# Patient Record
Sex: Female | Born: 1950
Health system: Southern US, Community
[De-identification: ages and names within clinical notes are randomized; demographics above are authoritative.]

## PROBLEM LIST (undated history)

## (undated) DIAGNOSIS — O24419 Gestational diabetes mellitus in pregnancy, unspecified control: Secondary | ICD-10-CM

## (undated) DIAGNOSIS — E785 Hyperlipidemia, unspecified: Secondary | ICD-10-CM

## (undated) DIAGNOSIS — I340 Nonrheumatic mitral (valve) insufficiency: Secondary | ICD-10-CM

## (undated) DIAGNOSIS — I10 Essential (primary) hypertension: Secondary | ICD-10-CM

## (undated) DIAGNOSIS — I493 Ventricular premature depolarization: Secondary | ICD-10-CM

## (undated) DIAGNOSIS — I471 Supraventricular tachycardia, unspecified: Secondary | ICD-10-CM

## (undated) DIAGNOSIS — R002 Palpitations: Secondary | ICD-10-CM

## (undated) HISTORY — DX: Ventricular premature depolarization: I49.3

## (undated) HISTORY — DX: Essential (primary) hypertension: I10

## (undated) HISTORY — DX: Supraventricular tachycardia: I47.1

## (undated) HISTORY — DX: Palpitations: R00.2

## (undated) HISTORY — DX: Nonrheumatic mitral (valve) insufficiency: I34.0

## (undated) HISTORY — DX: Supraventricular tachycardia, unspecified: I47.10

## (undated) HISTORY — PX: US ECHOCARDIOGRAPHY: HXRAD669

## (undated) HISTORY — DX: Gestational diabetes mellitus in pregnancy, unspecified control: O24.419

## (undated) HISTORY — DX: Hyperlipidemia, unspecified: E78.5

---

## 1998-06-11 ENCOUNTER — Other Ambulatory Visit: Admission: RE | Admit: 1998-06-11 | Discharge: 1998-06-11 | Payer: Self-pay | Admitting: Gynecology

## 1999-06-04 ENCOUNTER — Other Ambulatory Visit: Admission: RE | Admit: 1999-06-04 | Discharge: 1999-06-04 | Payer: Self-pay | Admitting: Gynecology

## 2000-06-16 ENCOUNTER — Encounter: Payer: Self-pay | Admitting: Gynecology

## 2000-06-16 ENCOUNTER — Encounter: Admission: RE | Admit: 2000-06-16 | Discharge: 2000-06-16 | Payer: Self-pay | Admitting: Gynecology

## 2000-06-23 ENCOUNTER — Other Ambulatory Visit: Admission: RE | Admit: 2000-06-23 | Discharge: 2000-06-23 | Payer: Self-pay | Admitting: Gynecology

## 2001-06-17 ENCOUNTER — Encounter: Admission: RE | Admit: 2001-06-17 | Discharge: 2001-06-17 | Payer: Self-pay | Admitting: Gynecology

## 2001-06-17 ENCOUNTER — Encounter: Payer: Self-pay | Admitting: Gynecology

## 2001-06-21 ENCOUNTER — Encounter: Admission: RE | Admit: 2001-06-21 | Discharge: 2001-06-21 | Payer: Self-pay | Admitting: Gynecology

## 2001-06-21 ENCOUNTER — Encounter: Payer: Self-pay | Admitting: Gynecology

## 2001-06-28 ENCOUNTER — Other Ambulatory Visit: Admission: RE | Admit: 2001-06-28 | Discharge: 2001-06-28 | Payer: Self-pay | Admitting: Gynecology

## 2002-06-22 ENCOUNTER — Encounter: Payer: Self-pay | Admitting: Gynecology

## 2002-06-22 ENCOUNTER — Encounter: Admission: RE | Admit: 2002-06-22 | Discharge: 2002-06-22 | Payer: Self-pay | Admitting: Gynecology

## 2002-06-29 ENCOUNTER — Other Ambulatory Visit: Admission: RE | Admit: 2002-06-29 | Discharge: 2002-06-29 | Payer: Self-pay | Admitting: Gynecology

## 2003-06-28 ENCOUNTER — Encounter: Admission: RE | Admit: 2003-06-28 | Discharge: 2003-06-28 | Payer: Self-pay | Admitting: Gynecology

## 2003-06-28 ENCOUNTER — Encounter: Payer: Self-pay | Admitting: Gynecology

## 2003-06-30 ENCOUNTER — Encounter: Payer: Self-pay | Admitting: Gynecology

## 2003-06-30 ENCOUNTER — Encounter: Admission: RE | Admit: 2003-06-30 | Discharge: 2003-06-30 | Payer: Self-pay | Admitting: Gynecology

## 2003-07-04 ENCOUNTER — Other Ambulatory Visit: Admission: RE | Admit: 2003-07-04 | Discharge: 2003-07-04 | Payer: Self-pay | Admitting: Gynecology

## 2004-06-28 ENCOUNTER — Encounter: Admission: RE | Admit: 2004-06-28 | Discharge: 2004-06-28 | Payer: Self-pay | Admitting: Gynecology

## 2004-07-04 ENCOUNTER — Other Ambulatory Visit: Admission: RE | Admit: 2004-07-04 | Discharge: 2004-07-04 | Payer: Self-pay | Admitting: Gynecology

## 2005-07-01 ENCOUNTER — Encounter: Admission: RE | Admit: 2005-07-01 | Discharge: 2005-07-01 | Payer: Self-pay | Admitting: Gynecology

## 2005-07-08 ENCOUNTER — Other Ambulatory Visit: Admission: RE | Admit: 2005-07-08 | Discharge: 2005-07-08 | Payer: Self-pay | Admitting: Gynecology

## 2006-07-02 ENCOUNTER — Encounter: Admission: RE | Admit: 2006-07-02 | Discharge: 2006-07-02 | Payer: Self-pay | Admitting: Family Medicine

## 2006-08-05 ENCOUNTER — Other Ambulatory Visit: Admission: RE | Admit: 2006-08-05 | Discharge: 2006-08-05 | Payer: Self-pay | Admitting: Gynecology

## 2007-08-25 ENCOUNTER — Other Ambulatory Visit: Admission: RE | Admit: 2007-08-25 | Discharge: 2007-08-25 | Payer: Self-pay | Admitting: Gynecology

## 2009-06-19 ENCOUNTER — Encounter: Admission: RE | Admit: 2009-06-19 | Discharge: 2009-06-19 | Payer: Self-pay | Admitting: Family Medicine

## 2009-10-21 ENCOUNTER — Encounter: Admission: RE | Admit: 2009-10-21 | Discharge: 2009-10-21 | Payer: Self-pay | Admitting: Family Medicine

## 2011-07-02 ENCOUNTER — Encounter: Payer: Self-pay | Admitting: Cardiology

## 2011-07-08 ENCOUNTER — Ambulatory Visit (INDEPENDENT_AMBULATORY_CARE_PROVIDER_SITE_OTHER): Payer: BC Managed Care – PPO | Admitting: Cardiology

## 2011-07-08 ENCOUNTER — Encounter: Payer: Self-pay | Admitting: Cardiology

## 2011-07-08 DIAGNOSIS — I498 Other specified cardiac arrhythmias: Secondary | ICD-10-CM

## 2011-07-08 DIAGNOSIS — E785 Hyperlipidemia, unspecified: Secondary | ICD-10-CM | POA: Insufficient documentation

## 2011-07-08 DIAGNOSIS — R5381 Other malaise: Secondary | ICD-10-CM

## 2011-07-08 DIAGNOSIS — R55 Syncope and collapse: Secondary | ICD-10-CM

## 2011-07-08 DIAGNOSIS — I10 Essential (primary) hypertension: Secondary | ICD-10-CM | POA: Insufficient documentation

## 2011-07-08 DIAGNOSIS — I471 Supraventricular tachycardia: Secondary | ICD-10-CM

## 2011-07-08 DIAGNOSIS — R5383 Other fatigue: Secondary | ICD-10-CM

## 2011-07-08 LAB — BASIC METABOLIC PANEL
BUN: 18 mg/dL (ref 6–23)
CO2: 30 mEq/L (ref 19–32)
Calcium: 9 mg/dL (ref 8.4–10.5)
Creatinine, Ser: 0.6 mg/dL (ref 0.4–1.2)
GFR: 103.92 mL/min (ref >60.00)
Glucose, Bld: 103 mg/dL — ABNORMAL HIGH (ref 70–99)

## 2011-07-08 LAB — CBC WITH DIFFERENTIAL/PLATELET
Basophils Absolute: 0 10*3/uL (ref 0.0–0.1)
HCT: 44.3 % (ref 36.0–46.0)
Hemoglobin: 15.1 g/dL — ABNORMAL HIGH (ref 12.0–15.0)
Lymphs Abs: 1 10*3/uL (ref 0.7–4.0)
MCHC: 34.2 g/dL (ref 30.0–36.0)
MCV: 93.4 fl (ref 78.0–100.0)
Monocytes Relative: 9.5 % (ref 3.0–12.0)
Platelets: 174 10*3/uL (ref 150.0–400.0)
RBC: 4.75 Mil/uL (ref 3.87–5.11)
RDW: 12.3 % (ref 11.5–14.6)

## 2011-07-08 LAB — MAGNESIUM: Magnesium: 2.6 mg/dL — ABNORMAL HIGH (ref 1.5–2.5)

## 2011-07-08 NOTE — Patient Instructions (Signed)
We will schedule you for lab work and an Echocardiogram today.  We will get a copy of your holter monitor.  We will call with the results and let you know if we should change your therapy.

## 2011-07-08 NOTE — Progress Notes (Signed)
Jean [name] Date of Birth: 05-25-1951   History of Present Illness: Jean [name] is seen at the request of Dr. Christell Constant for evaluation of near syncope. She is a pleasant 60 year old white female who has a history of hypertension and hyperlipidemia. She has a prior history of supraventricular tachycardia. She was evaluated here in December of 2009 including a stress echo which was normal and an event monitor which showed no episodes of tachycardia over one month. She has had some intermittent palpitations. On July 2 while driving she states she experience waves of lightheadedness and a feeling that she might pass out. Since that time she has complained of feeling faint. She has had a couple of episodes of significant flushing. With this she felt sweaty and clammy. She awoke one morning at 2:30 AM with a sensation that her heart was racing. She has felt very jittery and anxious. She reports that she cannot sleep at night. She complains of blurred vision. She has had some tinnitus as well. She had a 24-hour Holter monitor placed which demonstrated a mean heart rate of 65 beats per minute. She had no sustained tachycardia arrhythmia or bradycardia arrhythmia. She had a total 1734 PVCs which were isolated. There were episodes of bigeminy and trigeminy. She had very rare PACs with one 3 beat run of PACs.  Current Outpatient Prescriptions on File Prior to Visit  Medication Sig Dispense Refill  . aspirin 81 MG tablet Take 81 mg by mouth daily.        Marland Kitchen ezetimibe (ZETIA) 10 MG tablet Take 10 mg by mouth daily.        Marland Kitchen glucosamine-chondroitin 500-400 MG tablet Take 1 tablet by mouth 3 (three) times daily.        . Multiple Vitamin (MULTIVITAMIN) tablet Take 1 tablet by mouth daily.        . Omega-3 Fatty Acids (GNP FISH OIL) 1000 MG CAPS Take by mouth.        . ramipril (ALTACE) 10 MG tablet Take 10 mg by mouth daily.        . simvastatin (ZOCOR) 20 MG tablet       . valsartan-hydrochlorothiazide  (DIOVAN-HCT) 160-12.5 MG per tablet Take 1 tablet by mouth daily.          No Known Allergies  Past Medical History  Diagnosis Date  . SVT (supraventricular tachycardia)   . Hypertension   . Mitral insufficiency     TRIVIAL  . Hyperlipidemia   . Gestational diabetes     WITH HER CHILDBIRTH  . Palpitations     Past Surgical History  Procedure Date  . US echocardiography     EF 60%    History  Smoking status  . Never Smoker   Smokeless tobacco  . Not on file    History  Alcohol Use No    Family History  Problem Relation Age of Onset  . Heart disease Mother   . Hypertension Sister   . Diabetes Sister   . Anemia Paternal Grandmother     Review of Systems: The review of systems is positive for sensation that food lodges in her esophagus when she swallows. She has a history of TMJ syndrome. She has reduced her caffeine intake to 1/2 cup per day. All other systems were reviewed and are negative.  Physical Exam: BP 110/69  Pulse 64  Ht 5\' 6"  (1.676 m)  Wt 148 lb 12.8 oz (67.495 kg)  BMI 24.02 kg/m2 She is a  thin anxious white female in no acute distress. She is normocephalic, atraumatic. Pupils equal round and reactive to light accommodation. Extraocular movements are full. Oropharynx is clear. Neck is supple without JVD, adenopathy, thyromegaly, or bruits. Lungs are clear. Cardiac exam reveals a regular rate and rhythm. S1 is split. S2 is normal. There is no murmur or gallop. Abdomen is soft and nontender without masses or hepatosplenomegaly. Femoral and pedal pulses are 2+ and symmetric. She has no edema. Skin is warm and dry. She is alert and oriented x3. Cranial nerves II through XII are intact. LABORATORY DATA: Holter monitor as noted above.  Assessment / Plan:

## 2011-07-08 NOTE — Assessment & Plan Note (Signed)
Jean [name] has a multitude of symptoms without any clear identifying cause. She has had lightheadedness and dizziness with feeling of flushing and sweating. Her Holter monitor demonstrates frequent PVCs but this does not correlate with her symptoms. She has a  history of SVT but has had very rare symptoms of tachycardia. We will obtain baseline blood work today including chemistries, magnesium, TSH, CBC. We'll also obtain an echocardiogram. We will review the studies and see her back. One consideration would be to place her on a beta blocker. We would need to do this cautiously since her resting heart rate is 64. If we were to start on a beta blocker I would probably stop her Altace.

## 2011-07-09 ENCOUNTER — Encounter: Payer: Self-pay | Admitting: Family Medicine

## 2011-07-10 ENCOUNTER — Encounter: Payer: Self-pay | Admitting: *Deleted

## 2011-07-11 ENCOUNTER — Encounter: Payer: Self-pay | Admitting: Cardiology

## 2011-07-11 ENCOUNTER — Telehealth: Payer: Self-pay | Admitting: Cardiology

## 2011-07-11 NOTE — Telephone Encounter (Signed)
Message copied by Lorayne Bender on Fri Jul 11, 2011  3:55 PM ------      Message from: [place], PETER M      Created: Tue Jul 08, 2011  2:52 PM       Labs are all normal. Please report.

## 2011-07-11 NOTE — Telephone Encounter (Signed)
Called wanting to know the results of her most recent blood work. Please call back.

## 2011-07-11 NOTE — Telephone Encounter (Signed)
Notified of lab results. Will send to Dr. Christell Constant

## 2011-07-15 ENCOUNTER — Ambulatory Visit (HOSPITAL_COMMUNITY): Payer: BC Managed Care – PPO | Attending: Cardiology | Admitting: Radiology

## 2011-07-15 DIAGNOSIS — I379 Nonrheumatic pulmonary valve disorder, unspecified: Secondary | ICD-10-CM | POA: Insufficient documentation

## 2011-07-15 DIAGNOSIS — I079 Rheumatic tricuspid valve disease, unspecified: Secondary | ICD-10-CM | POA: Insufficient documentation

## 2011-07-15 DIAGNOSIS — I471 Supraventricular tachycardia, unspecified: Secondary | ICD-10-CM

## 2011-07-15 DIAGNOSIS — R55 Syncope and collapse: Secondary | ICD-10-CM

## 2011-07-15 DIAGNOSIS — E785 Hyperlipidemia, unspecified: Secondary | ICD-10-CM | POA: Insufficient documentation

## 2011-07-15 DIAGNOSIS — I10 Essential (primary) hypertension: Secondary | ICD-10-CM | POA: Insufficient documentation

## 2011-07-15 DIAGNOSIS — I059 Rheumatic mitral valve disease, unspecified: Secondary | ICD-10-CM | POA: Insufficient documentation

## 2011-07-17 ENCOUNTER — Telehealth: Payer: Self-pay | Admitting: *Deleted

## 2011-07-17 NOTE — Telephone Encounter (Signed)
Notified of echo results. Advised to stop Altace. She will check BP and Pulse and call if BP is over 140/100 and pulse over 100. Is afraid to start Metoprolol now because her GYN put her on Zoloft and Xanax. And those make her a little light headed. Advised if she doesn't feel like Xanax and Zoloft is helping w/her anxiety she will call back and will send in Rx for Metoprolol. Otherwise we will see her 8/23. She will call sooner if needs advice

## 2011-07-17 NOTE — Telephone Encounter (Signed)
Message copied by Lorayne Bender on Thu Jul 17, 2011  5:18 PM ------      Message from: [place], PETER M      Created: Tue Jul 15, 2011  8:59 PM       Echo looks OK. Normal LV function. Valves ok. Only mild MR. Would suggest a trial of low dose metoprolol 12.5 mg bid to see if this helps with her symptoms. Hold Altace and have her follow her BP and pulse closely. F/u OV in 3 weeks.

## 2011-07-18 ENCOUNTER — Telehealth: Payer: Self-pay | Admitting: Cardiology

## 2011-07-18 MED ORDER — METOPROLOL TARTRATE 25 MG PO TABS: ORAL_TABLET | ORAL | Status: DC

## 2011-07-18 NOTE — Telephone Encounter (Signed)
Called stating BP has been 150/72;hr 68 yest; today BP 162/72 hr 65; wants to know if she should start taking Metoprolol. Per Dr. [place] needs to stay on Ramipril or start Metoprolol 12.5 mg BID. She states she will start on med. Advised she needs to check BP and Pulse BID and call if too low or high. Will be seen next week in office. Rx sent to [facility] [place],[place]. States the Zoloft has not helped much w/anxiety. She has had several "panic attacks" today.

## 2011-07-18 NOTE — Telephone Encounter (Signed)
Pt has questions about Beta Blocker and Blood Pressure Medicine, please call pt back

## 2011-07-28 ENCOUNTER — Other Ambulatory Visit: Payer: Self-pay | Admitting: Gynecology

## 2011-08-14 ENCOUNTER — Encounter: Payer: Self-pay | Admitting: Cardiology

## 2011-08-14 ENCOUNTER — Ambulatory Visit (INDEPENDENT_AMBULATORY_CARE_PROVIDER_SITE_OTHER): Payer: BC Managed Care – PPO | Admitting: Cardiology

## 2011-08-14 VITALS — BP 126/72 | HR 60 | Ht 66.0 in | Wt 139.4 lb

## 2011-08-14 DIAGNOSIS — I498 Other specified cardiac arrhythmias: Secondary | ICD-10-CM

## 2011-08-14 DIAGNOSIS — I471 Supraventricular tachycardia: Secondary | ICD-10-CM

## 2011-08-14 NOTE — Patient Instructions (Signed)
I will communicate with your primary care concerning options for possible reduction/ amendments to your medications.  Continue your metoprolol for now.

## 2011-08-15 NOTE — Assessment & Plan Note (Signed)
Her rhythm was well controlled on a low dose of beta blockers. Since she has improved with her symptoms I recommended she continue it. I explained that this does not reduce her overall cardiovascular risk or reduce her risk of dying but that her risk is very low from her prior cardiac evaluation. I also explained that I do not think that her multitude of symptoms are cardiac related. Given her GI symptoms I suggested we may want to try stopping her Zetia and fish oil. She is only on lipid-lowering therapy for primary prevention. We also discussed the potential of reducing her antihypertensive medication since her blood pressure has been excellent and perhaps she doesn't need to take HCTZ as well as Diovan. I think this is best managed by her primary care physician and I will plan on seeing her back again in 6 months.

## 2011-08-15 NOTE — Progress Notes (Signed)
   Jean [name] Date of Birth: 1951-05-28   History of Present Illness: Jean [name] is seen today for followup. Since her last visit we placed her on a low dose of metoprolol. She states that this has regulated her rhythm well with only minimal palpitations. She has had no dizziness or syncope. She denies any chest pain or shortness of breath. Review of systems is extensively positive. She has lost 9 pounds. She complains of bruising easily. She has had some white spots on her oral mucosa that then turned into red splotches. She has relatively frequent soft stools. She has been placed on Zoloft for panic attacks and has been taking Xanax for sleep as well. She doesn't feel that this is really helped and she will try to taper off of Xanax. She does complain of fatigue.  Current Outpatient Prescriptions on File Prior to Visit  Medication Sig Dispense Refill  . aspirin 81 MG tablet Take 81 mg by mouth daily.        Marland Kitchen ezetimibe (ZETIA) 10 MG tablet Take 10 mg by mouth daily.        Marland Kitchen glucosamine-chondroitin 500-400 MG tablet Take 1 tablet by mouth daily.       . metoprolol tartrate (LOPRESSOR) 25 MG tablet Take 1/2 tablet twice a day  30 tablet  5  . Omega-3 Fatty Acids (GNP FISH OIL) 1000 MG CAPS Take by mouth.        . simvastatin (ZOCOR) 20 MG tablet daily.       . valsartan-hydrochlorothiazide (DIOVAN-HCT) 160-12.5 MG per tablet Take 1 tablet by mouth daily.          No Known Allergies  Past Medical History  Diagnosis Date  . SVT (supraventricular tachycardia)   . Hypertension   . Mitral insufficiency     TRIVIAL  . Hyperlipidemia   . Gestational diabetes     WITH HER CHILDBIRTH  . Palpitations     Past Surgical History  Procedure Date  . US echocardiography     EF 60%    History  Smoking status  . Never Smoker   Smokeless tobacco  . Not on file    History  Alcohol Use No    Family History  Problem Relation Age of Onset  . Heart disease Mother   . Hypertension  Sister   . Diabetes Sister   . Anemia Paternal Grandmother     Review of Systems: Extensively positive review of systems as noted above.All other systems were reviewed and are negative.  Physical Exam: BP 126/72  Pulse 60  Ht 5\' 6"  (1.676 m)  Wt 139 lb 6.4 oz (63.231 kg)  BMI 22.50 kg/m2 She is a thin anxious white female in no acute distress. She is normocephalic, atraumatic. Pupils equal round and reactive to light accommodation. Extraocular movements are full. Oropharynx is clear. Neck is supple without JVD, adenopathy, thyromegaly, or bruits. Lungs are clear. Cardiac exam reveals a regular rate and rhythm. S1 is split. S2 is normal. There is no murmur or gallop. Abdomen is soft and nontender without masses or hepatosplenomegaly. Femoral and pedal pulses are 2+ and symmetric. She has no edema. Skin is warm and dry. She is alert and oriented x3. Cranial nerves II through XII are intact. LABORATORY DATA:   Assessment / Plan:

## 2012-01-07 ENCOUNTER — Other Ambulatory Visit: Payer: Self-pay

## 2012-01-07 MED ORDER — METOPROLOL TARTRATE 25 MG PO TABS: ORAL_TABLET | ORAL | Status: DC

## 2012-02-11 ENCOUNTER — Ambulatory Visit (INDEPENDENT_AMBULATORY_CARE_PROVIDER_SITE_OTHER): Payer: BC Managed Care – PPO | Admitting: Cardiology

## 2012-02-11 ENCOUNTER — Encounter: Payer: Self-pay | Admitting: Cardiology

## 2012-02-11 VITALS — BP 143/76 | HR 48 | Ht 66.0 in | Wt 146.8 lb

## 2012-02-11 DIAGNOSIS — I10 Essential (primary) hypertension: Secondary | ICD-10-CM

## 2012-02-11 DIAGNOSIS — I498 Other specified cardiac arrhythmias: Secondary | ICD-10-CM

## 2012-02-11 DIAGNOSIS — E785 Hyperlipidemia, unspecified: Secondary | ICD-10-CM

## 2012-02-11 DIAGNOSIS — I471 Supraventricular tachycardia: Secondary | ICD-10-CM

## 2012-02-11 MED ORDER — METOPROLOL TARTRATE 25 MG PO TABS: ORAL_TABLET | ORAL | Status: DC

## 2012-02-11 NOTE — Assessment & Plan Note (Signed)
A CT is very well controlled on low-dose beta blocker therapy. She is bradycardic but is tolerating this well. Since she is asymptomatic, we will continue with her current beta blocker therapy and followup again in 6 months.

## 2012-02-11 NOTE — Progress Notes (Signed)
   Jean [name] Date of Birth: 1951-08-14   History of Present Illness: [name] is seen today for followup. She reports that she is doing very well. She's only had one episode of tachycardia lasting 5 minutes. Otherwise she feels that her heart rate is very smooth. She is tolerating her medications well. Her energy is good. She has no dizziness or syncope. Her multiple GI complaints have also improved. She did stop taking fish oil but remains on Zetia. She also is no longer taking Xanax.  Current Outpatient Prescriptions on File Prior to Visit  Medication Sig Dispense Refill  . aspirin 81 MG tablet Take 81 mg by mouth daily.        . Cholecalciferol (VITAMIN D-3 PO) Take by mouth daily.        Marland Kitchen ezetimibe (ZETIA) 10 MG tablet Take 10 mg by mouth daily.        Marland Kitchen glucosamine-chondroitin 500-400 MG tablet Take 1 tablet by mouth daily.       . Omega-3 Fatty Acids (GNP FISH OIL) 1000 MG CAPS Take by mouth.        . sertraline (ZOLOFT) 50 MG tablet Take 50 mg by mouth daily.        . simvastatin (ZOCOR) 20 MG tablet daily.       . valsartan-hydrochlorothiazide (DIOVAN-HCT) 160-12.5 MG per tablet Take 1 tablet by mouth daily.          No Known Allergies  Past Medical History  Diagnosis Date  . SVT (supraventricular tachycardia)   . Hypertension   . Mitral insufficiency     TRIVIAL  . Hyperlipidemia   . Gestational diabetes     WITH HER CHILDBIRTH  . Palpitations     Past Surgical History  Procedure Date  . US echocardiography     EF 60%    History  Smoking status  . Never Smoker   Smokeless tobacco  . Not on file    History  Alcohol Use No    Family History  Problem Relation Age of Onset  . Heart disease Mother   . Hypertension Sister   . Diabetes Sister   . Anemia Paternal Grandmother     Review of Systems: Extensively positive review of systems as noted above.All other systems were reviewed and are negative.  Physical Exam: BP 143/76  Pulse 48  Ht 5'  6" (1.676 m)  Wt 66.588 kg (146 lb 12.8 oz)  BMI 23.69 kg/m2 She is a thin anxious white female in no acute distress. She is normocephalic, atraumatic. Pupils equal round and reactive to light accommodation. Extraocular movements are full. Oropharynx is clear. Neck is supple without JVD, adenopathy, thyromegaly, or bruits. Lungs are clear. Cardiac exam reveals a regular rate and rhythm. S1 is split. S2 is normal. There is no murmur or gallop. Abdomen is soft and nontender without masses or hepatosplenomegaly. Femoral and pedal pulses are 2+ and symmetric. She has no edema. Skin is warm and dry. She is alert and oriented x3. Cranial nerves II through XII are intact. LABORATORY DATA: ECG today demonstrates sinus bradycardia with a rate of 47 beats per minute. It is otherwise normal.  Assessment / Plan:

## 2012-02-11 NOTE — Patient Instructions (Signed)
Continue your current medication.  I will see you again in 6 months.   

## 2012-02-11 NOTE — Assessment & Plan Note (Signed)
Blood pressure is borderline elevated today. I recommended continuing her Diovan HCT and metoprolol.

## 2012-06-30 ENCOUNTER — Other Ambulatory Visit: Payer: Self-pay | Admitting: Cardiology

## 2012-09-24 ENCOUNTER — Other Ambulatory Visit: Payer: Self-pay | Admitting: Family Medicine

## 2012-09-24 DIAGNOSIS — Z1231 Encounter for screening mammogram for malignant neoplasm of breast: Secondary | ICD-10-CM

## 2012-10-28 ENCOUNTER — Ambulatory Visit
Admission: RE | Admit: 2012-10-28 | Discharge: 2012-10-28 | Disposition: A | Discharge status: Home / Self Care | Payer: Self-pay | Source: Ambulatory Visit | Attending: Family Medicine | Admitting: Family Medicine

## 2012-10-28 DIAGNOSIS — Z1231 Encounter for screening mammogram for malignant neoplasm of breast: Secondary | ICD-10-CM

## 2012-12-31 ENCOUNTER — Other Ambulatory Visit: Payer: Self-pay

## 2012-12-31 MED ORDER — METOPROLOL TARTRATE 25 MG PO TABS: ORAL_TABLET | ORAL | Status: DC

## 2013-02-28 ENCOUNTER — Other Ambulatory Visit: Payer: Self-pay | Admitting: Cardiology

## 2013-03-05 ENCOUNTER — Other Ambulatory Visit: Payer: Self-pay | Admitting: *Deleted

## 2013-03-05 DIAGNOSIS — Z780 Asymptomatic menopausal state: Secondary | ICD-10-CM

## 2013-03-23 ENCOUNTER — Encounter: Payer: Self-pay | Admitting: Cardiology

## 2013-03-23 ENCOUNTER — Ambulatory Visit (INDEPENDENT_AMBULATORY_CARE_PROVIDER_SITE_OTHER): Payer: BC Managed Care – PPO | Admitting: Cardiology

## 2013-03-23 VITALS — BP 114/78 | HR 49 | Ht 66.0 in | Wt 161.0 lb

## 2013-03-23 DIAGNOSIS — I471 Supraventricular tachycardia: Secondary | ICD-10-CM

## 2013-03-23 DIAGNOSIS — I10 Essential (primary) hypertension: Secondary | ICD-10-CM

## 2013-03-23 DIAGNOSIS — E785 Hyperlipidemia, unspecified: Secondary | ICD-10-CM

## 2013-03-23 DIAGNOSIS — I498 Other specified cardiac arrhythmias: Secondary | ICD-10-CM

## 2013-03-23 MED ORDER — METOPROLOL TARTRATE 25 MG PO TABS: 25.0000 mg | ORAL_TABLET | Freq: Two times a day (BID) | ORAL | Status: DC

## 2013-03-23 NOTE — Patient Instructions (Signed)
Continue your current therapy  I will see you in one year   

## 2013-03-23 NOTE — Progress Notes (Signed)
   Jean [name] Date of Birth: [birth year]   History of Present Illness: Jean [name] is seen today for yearly followup. She reports she has done very well over the past year. She's only had 2 episodes of tachycardia. Both may have occurred around increased caffeine use. They'll last for 5-10 minutes. She did not do any Valsalva maneuvers or take any additional medication. She feels very well. She's had no dizziness or syncope. Blood pressure has been under control. Her lipids are followed by her primary care.  Current Outpatient Prescriptions on File Prior to Visit  Medication Sig Dispense Refill  . aspirin 81 MG tablet Take 81 mg by mouth daily.        . Cholecalciferol (VITAMIN D-3 PO) Take by mouth daily.        Marland Kitchen ezetimibe (ZETIA) 10 MG tablet Take 10 mg by mouth daily.        Marland Kitchen glucosamine-chondroitin 500-400 MG tablet Take 1 tablet by mouth daily.       . sertraline (ZOLOFT) 50 MG tablet Take 50 mg by mouth daily.        . simvastatin (ZOCOR) 20 MG tablet daily.       . valsartan-hydrochlorothiazide (DIOVAN-HCT) 160-12.5 MG per tablet Take 1 tablet by mouth daily.         No current facility-administered medications on file prior to visit.    No Known Allergies  Past Medical History  Diagnosis Date  . SVT (supraventricular tachycardia)   . Hypertension   . Mitral insufficiency     TRIVIAL  . Hyperlipidemia   . Gestational diabetes     WITH HER CHILDBIRTH  . Palpitations     Past Surgical History  Procedure Laterality Date  . US echocardiography      EF 60%    History  Smoking status  . Never Smoker   Smokeless tobacco  . Not on file    History  Alcohol Use No    Family History  Problem Relation Age of Onset  . Heart disease Mother   . Hypertension Sister   . Diabetes Sister   . Anemia Paternal Grandmother     Review of Systems: Extensively positive review of systems as noted above.All other systems were reviewed and are negative.  Physical  Exam: BP 114/78  Pulse 49  Ht 5\' 6"  (1.676 m)  Wt 161 lb (73.029 kg)  BMI 26 kg/m2 She is a thin anxious white female in no acute distress. HEENT exam is unremarkable. Neck is supple without JVD, adenopathy, thyromegaly, or bruits. Lungs are clear. Cardiac exam reveals a regular rate and rhythm. S1 is split. S2 is normal. There is no murmur or gallop. Abdomen is soft and nontender without masses or hepatosplenomegaly. Femoral and pedal pulses are 2+ and symmetric. She has no edema. Skin is warm and dry. She is alert and oriented x3. Cranial nerves II through XII are intact. LABORATORY DATA: ECG today demonstrates sinus bradycardia with a rate of 49 beats per minute. It is otherwise normal.  Assessment / Plan: 1. Supraventricular tachycardia. Well controlled on very low dose of metoprolol. We will continue her current therapy. I'll followup again in one year.  2. Hypertension, well controlled.  3. Hyperlipidemia. On Zetia and Zocor.

## 2013-05-17 ENCOUNTER — Other Ambulatory Visit: Payer: Self-pay | Admitting: *Deleted

## 2013-05-17 MED ORDER — EZETIMIBE 10 MG PO TABS: 10.0000 mg | ORAL_TABLET | Freq: Every day | ORAL | Status: DC

## 2013-06-01 ENCOUNTER — Ambulatory Visit (INDEPENDENT_AMBULATORY_CARE_PROVIDER_SITE_OTHER): Payer: BC Managed Care – PPO

## 2013-06-01 ENCOUNTER — Ambulatory Visit (INDEPENDENT_AMBULATORY_CARE_PROVIDER_SITE_OTHER): Payer: BC Managed Care – PPO | Admitting: Pharmacist

## 2013-06-01 VITALS — Ht 66.0 in | Wt 166.0 lb

## 2013-06-01 DIAGNOSIS — Z13820 Encounter for screening for osteoporosis: Secondary | ICD-10-CM

## 2013-06-01 DIAGNOSIS — Z780 Asymptomatic menopausal state: Secondary | ICD-10-CM

## 2013-06-01 NOTE — Progress Notes (Signed)
[facility] Current Height: Height: 5\' 6"  (167.6 cm)      Max Lifetime Height:  5' 6.5" Current Weight: Weight: 166 lb (75.297 kg)       Ethnicity:Caucasian    HPI: Does pt already have a diagnosis of:  Osteopenia?  No Osteoporosis?  No  Back Pain?  No       Kyphosis?  No Prior fracture?  No Med(s) for Osteoporosis/Osteopenia:  none Med(s) previously tried for Osteoporosis/Osteopenia:  none                                                             PMH: Age at menopause:  62 yo Hysterectomy?  No Oophorectomy?  No HRT? Yes - Former.  Type/duration: prempro/premphase and climara patch - 5 years Steroid Use?  No Thyroid med?  No History of cancer?  No History of digestive disorders (ie Crohn's)?  No Current or previous eating disorders?  No Last Vitamin D Result:  33.8 (2011) Last GFR Result:  84 (02/21/2013)   FH/SH: Family history of osteoporosis?  No Parent with history of hip fracture?  No Family history of breast cancer?  No Exercise?  Yes - [employer] 5 days per week (mixture of strength and aerobic activity) Smoking?  No Alcohol?  No    Calcium Assessment Calcium Intake  # of servings/day  Calcium mg  Milk (8 oz) 0.5  x  300  = 150mg   Yogurt (4 oz) 0.5 x  200 = 100mg   Cheese (1 oz) 0.5 x  200 = 100mg   Other Calcium sources   250mg   Ca supplement none = none   Estimated calcium intake per day 600mg     DEXA Results Date of Test T-Score for AP Spine L1-L4 T-Score for Total Left Hip T-Score for Total Right Hip  06/01/2013 1.4 0.5 0.9                  Assessment: Normal BMD   Recommendations: 1.  recommend calcium 1200mg  daily through supplementation or diet.  - calcium handout given 2.  continue weight bearing exercise - 30 minutes at least 4 days  per week.   3.  Counseled and educated about fall risk and prevention.  Recheck DEXA:  2 years  Time spent counseling patient:  10 minutes

## 2013-08-19 ENCOUNTER — Other Ambulatory Visit: Payer: Self-pay | Admitting: Nurse Practitioner

## 2013-09-22 ENCOUNTER — Other Ambulatory Visit: Payer: Self-pay | Admitting: Nurse Practitioner

## 2013-09-24 ENCOUNTER — Other Ambulatory Visit: Payer: Self-pay | Admitting: Nurse Practitioner

## 2013-09-26 NOTE — Telephone Encounter (Signed)
ntbs

## 2013-09-26 NOTE — Telephone Encounter (Signed)
Last seen 02/21/13  MMM

## 2013-09-26 NOTE — Telephone Encounter (Signed)
Last lipids 02/21/13  MMM

## 2013-10-22 ENCOUNTER — Other Ambulatory Visit: Payer: Self-pay | Admitting: Nurse Practitioner

## 2013-12-03 ENCOUNTER — Other Ambulatory Visit: Payer: Self-pay | Admitting: Nurse Practitioner

## 2013-12-05 NOTE — Telephone Encounter (Signed)
No refill until seen for labs 

## 2013-12-05 NOTE — Telephone Encounter (Signed)
Last seen 02/21/13 MMM Last lipid 02/21/13

## 2013-12-18 ENCOUNTER — Other Ambulatory Visit: Payer: Self-pay | Admitting: Nurse Practitioner

## 2014-01-04 ENCOUNTER — Ambulatory Visit (INDEPENDENT_AMBULATORY_CARE_PROVIDER_SITE_OTHER): Payer: BC Managed Care – PPO | Admitting: Nurse Practitioner

## 2014-01-04 ENCOUNTER — Encounter: Payer: Self-pay | Admitting: Nurse Practitioner

## 2014-01-04 VITALS — BP 137/60 | HR 57 | Temp 97.5°F | Ht 66.0 in | Wt 160.0 lb

## 2014-01-04 DIAGNOSIS — E785 Hyperlipidemia, unspecified: Secondary | ICD-10-CM

## 2014-01-04 DIAGNOSIS — I10 Essential (primary) hypertension: Secondary | ICD-10-CM

## 2014-01-04 DIAGNOSIS — Z23 Encounter for immunization: Secondary | ICD-10-CM

## 2014-01-04 DIAGNOSIS — I471 Supraventricular tachycardia: Secondary | ICD-10-CM

## 2014-01-04 DIAGNOSIS — I498 Other specified cardiac arrhythmias: Secondary | ICD-10-CM

## 2014-01-04 MED ORDER — SIMVASTATIN 20 MG PO TABS: 20.0000 mg | ORAL_TABLET | Freq: Every day | ORAL | Status: DC

## 2014-01-04 MED ORDER — VALSARTAN-HYDROCHLOROTHIAZIDE 160-12.5 MG PO TABS: 1.0000 | ORAL_TABLET | Freq: Every day | ORAL | Status: DC

## 2014-01-04 MED ORDER — SERTRALINE HCL 50 MG PO TABS: 50.0000 mg | ORAL_TABLET | Freq: Every day | ORAL | Status: DC

## 2014-01-04 MED ORDER — EZETIMIBE 10 MG PO TABS: 10.0000 mg | ORAL_TABLET | Freq: Every day | ORAL | Status: DC

## 2014-01-04 NOTE — Patient Instructions (Signed)

## 2014-01-04 NOTE — Progress Notes (Signed)
Subjective:    Patient ID: Jean [name], female    DOB: 1951-09-28, 63 y.o.   MRN: [id number]  Hypertension This is a chronic problem. The current episode started more than 1 year ago. The problem is unchanged. The problem is controlled. Pertinent negatives include no blurred vision, chest pain, headaches, neck pain, palpitations, peripheral edema, shortness of breath or sweats. There are no associated agents to hypertension. Risk factors for coronary artery disease include dyslipidemia, family history, post-menopausal state and stress. Past treatments include angiotensin blockers, calcium channel blockers and beta blockers. The current treatment provides significant improvement.  Hyperlipidemia This is a chronic problem. The current episode started more than 1 year ago. The problem is uncontrolled. Recent lipid tests were reviewed and are high. She has no history of diabetes, hypothyroidism or obesity. Factors aggravating her hyperlipidemia include thiazides. Pertinent negatives include no chest pain or shortness of breath. Current antihyperlipidemic treatment includes statins and ezetimibe. The current treatment provides moderate improvement of lipids. There are no compliance problems.  Risk factors for coronary artery disease include dyslipidemia, family history and post-menopausal.  Depression Currently on zoloft- doing well- no side effects   Review of Systems  Eyes: Negative for blurred vision.  Respiratory: Negative for shortness of breath.   Cardiovascular: Negative for chest pain and palpitations.  Musculoskeletal: Negative for neck pain.  Neurological: Negative for headaches.  All other systems reviewed and are negative.       Objective:   Physical Exam  Constitutional: She is oriented to person, place, and time. She appears well-developed and well-nourished.  HENT:  Nose: Nose normal.  Mouth/Throat: Oropharynx is clear and moist.  Eyes: EOM are normal.  Neck: Trachea  normal, normal range of motion and full passive range of motion without pain. Neck supple. No JVD present. Carotid bruit is not present. No thyromegaly present.  Cardiovascular: Normal rate, regular rhythm and intact distal pulses.  Exam reveals gallop and S3. Exam reveals no friction rub.   No murmur heard. Pulmonary/Chest: Effort normal and breath sounds normal.  Abdominal: Soft. Bowel sounds are normal. She exhibits no distension and no mass. There is no tenderness.  Musculoskeletal: Normal range of motion.  Lymphadenopathy:    She has no cervical adenopathy.  Neurological: She is alert and oriented to person, place, and time. She has normal reflexes.  Skin: Skin is warm and dry.  Psychiatric: She has a normal mood and affect. Her behavior is normal. Judgment and thought content normal.   BP 137/60  Pulse 57  Temp(Src) 97.5 F (36.4 C) (Oral)  Ht $R'5\' 6"'yE$  (1.676 m)  Wt 160 lb (72.576 kg)  BMI 25.84 kg/m2        Assessment & Plan:   1. HTN (hypertension)   2. Hyperlipidemia   3. SVT (supraventricular tachycardia)    Orders Placed This Encounter  Procedures  . CMP14+EGFR    Standing Status: Future     Number of Occurrences:      Standing Expiration Date: 01/04/2015  . NMR, lipoprofile    Standing Status: Future     Number of Occurrences:      Standing Expiration Date: 01/04/2015   Meds ordered this encounter  Medications  . sertraline (ZOLOFT) 50 MG tablet    Sig: Take 1 tablet (50 mg total) by mouth daily.    Dispense:  90 tablet    Refill:  1    Order Specific Question:  Supervising Provider    Answer:  Chipper Herb [  [id number]]  . simvastatin (ZOCOR) 20 MG tablet    Sig: Take 1 tablet (20 mg total) by mouth daily at 6 PM.    Dispense:  90 tablet    Refill:  1    Order Specific Question:  Supervising Provider    Answer:  Chipper Herb [[id number]]  . valsartan-hydrochlorothiazide (DIOVAN-HCT) 160-12.5 MG per tablet    Sig: Take 1 tablet by mouth daily.    Dispense:  90  tablet    Refill:  1    Order Specific Question:  Supervising Provider    Answer:  Chipper Herb [[id number]]  . ezetimibe (ZETIA) 10 MG tablet    Sig: Take 1 tablet (10 mg total) by mouth daily.    Dispense:  90 tablet    Refill:  1    Needs to be seen before next refill    Order Specific Question:  Supervising Provider    Answer:  Chipper Herb [[id number]]   Flu shot and zostavax shot today Health maintenance reviewed Diet and exercise encouraged Continue all meds Follow up  In 3 months   [place], FNP

## 2014-01-05 ENCOUNTER — Other Ambulatory Visit (INDEPENDENT_AMBULATORY_CARE_PROVIDER_SITE_OTHER): Payer: BC Managed Care – PPO | Admitting: *Deleted

## 2014-01-05 DIAGNOSIS — E785 Hyperlipidemia, unspecified: Secondary | ICD-10-CM

## 2014-01-05 DIAGNOSIS — Z23 Encounter for immunization: Secondary | ICD-10-CM

## 2014-01-05 DIAGNOSIS — I10 Essential (primary) hypertension: Secondary | ICD-10-CM

## 2014-01-05 NOTE — Progress Notes (Signed)
Pt came in for labs only 

## 2014-01-05 NOTE — Addendum Note (Signed)
Addended by: Thana Ates on: 01/05/2014 08:30 AM   Modules accepted: Orders

## 2014-01-05 NOTE — Patient Instructions (Signed)
Shingles Vaccine  What You Need to Know  WHAT IS SHINGLES?  · Shingles is a painful skin rash, often with blisters. It is also called Herpes Zoster or just Zoster.  · A shingles rash usually appears on one side of the face or body and lasts from 2 to 4 weeks. Its main symptom is pain, which can be quite severe. Other symptoms of shingles can include fever, headache, chills, and upset stomach. Very rarely, a shingles infection can lead to pneumonia, hearing problems, blindness, brain inflammation (encephalitis), or death.  · For about 1 person in 5, severe pain can continue even after the rash clears up. This is called post-herpetic neuralgia.  · Shingles is caused by the Varicella Zoster virus. This is the same virus that causes chickenpox. Only someone who has had a case of chickenpox or rarely, has gotten chickenpox vaccine, can get shingles. The virus stays in your body. It can reappear many years later to cause a case of shingles.  · You cannot catch shingles from another person with shingles. However, a person who has never had chickenpox (or chickenpox vaccine) could get chickenpox from someone with shingles. This is not very common.  · Shingles is far more common in people 50 and older than in younger people. It is also more common in people whose immune systems are weakened because of a disease such as cancer or drugs such as steroids or chemotherapy.  · At least 1 million people get shingles per year in the [place].  SHINGLES VACCINE  · A vaccine for shingles was licensed in 2006. In clinical trials, the vaccine reduced the risk of shingles by 50%. It can also reduce the pain in people who still get shingles after being vaccinated.  · A single dose of shingles vaccine is recommended for adults 60 years of age and older.  SOME PEOPLE SHOULD NOT GET SHINGLES VACCINE OR SHOULD WAIT  A person should not get shingles vaccine if he or she:  · Has ever had a life-threatening allergic reaction to gelatin, the  antibiotic neomycin, or any other component of shingles vaccine. Tell your caregiver if you have any severe allergies.  · Has a weakened immune system because of current:  · AIDS or another disease that affects the immune system.  · Treatment with drugs that affect the immune system, such as prolonged use of high-dose steroids.  · Cancer treatment, such as radiation or chemotherapy.  · Cancer affecting the bone marrow or lymphatic system, such as leukemia or lymphoma.  · Is pregnant, or might be pregnant. Women should not become pregnant until at least 4 weeks after getting shingles vaccine.  Someone with a minor illness, such as a cold, may be vaccinated. Anyone with a moderate or severe acute illness should usually wait until he or she recovers before getting the vaccine. This includes anyone with a temperature of 101.3° F (38° C) or higher.  WHAT ARE THE RISKS FROM SHINGLES VACCINE?  · A vaccine, like any medicine, could possibly cause serious problems, such as severe allergic reactions. However, the risk of a vaccine causing serious harm, or death, is extremely small.  · No serious problems have been identified with shingles vaccine.  Mild Problems  · Redness, soreness, swelling, or itching at the site of the injection (about 1 person in 3).  · Headache (about 1 person in 70).  Like all vaccines, shingles vaccine is being closely monitored for unusual or severe problems.  WHAT IF   THERE IS A MODERATE OR SEVERE REACTION?  What should I look for?  Any unusual condition, such as a severe allergic reaction or a high fever. If a severe allergic reaction occurred, it would be within a few minutes to an hour after the shot. Signs of a serious allergic reaction can include difficulty breathing, weakness, hoarseness or wheezing, a fast heartbeat, hives, dizziness, paleness, or swelling of the throat.  What should I do?  · Call your caregiver, or get the person to a caregiver right away.  · Tell the caregiver what  happened, the date and time it happened, and when the vaccination was given.  · Ask the caregiver to report the reaction by filing a Vaccine Adverse Event Reporting System (VAERS) form. Or, you can file this report through the VAERS web site at www.vaers.hhs.gov or by calling 1-800-822-7967.  VAERS does not provide medical advice.  HOW CAN I LEARN MORE?  · Ask your caregiver. He or she can give you the vaccine package insert or suggest other sources of information.  · Contact the Centers for Disease Control and Prevention (CDC):  · Call 1-800-232-4636 (1-800-CDC-INFO).  · Visit the CDC website at www.cdc.gov/vaccines  CDC Shingles Vaccine VIS (09/26/08)  Document Released: 10/05/2006 Document Revised: 03/01/2012 Document Reviewed: 03/30/2013  ExitCare® Patient Information ©2014 [employer].

## 2014-01-07 LAB — CMP14+EGFR
ALK PHOS: 87 IU/L (ref 39–117)
ALT: 24 IU/L (ref 0–32)
AST: 20 IU/L (ref 0–40)
Albumin/Globulin Ratio: 1.8 (ref 1.1–2.5)
Albumin: 4.3 g/dL (ref 3.6–4.8)
BILIRUBIN TOTAL: 1 mg/dL (ref 0.0–1.2)
BUN / CREAT RATIO: 18 (ref 11–26)
BUN: 14 mg/dL (ref 8–27)
CHLORIDE: 102 mmol/L (ref 97–108)
CO2: 25 mmol/L (ref 18–29)
Calcium: 8.9 mg/dL (ref 8.6–10.2)
Creatinine, Ser: 0.76 mg/dL (ref 0.57–1.00)
GFR calc non Af Amer: 84 mL/min/{1.73_m2} (ref >59)
GFR, EST AFRICAN AMERICAN: 97 mL/min/{1.73_m2} (ref >59)
Globulin, Total: 2.4 g/dL (ref 1.5–4.5)
Glucose: 99 mg/dL (ref 65–99)
Potassium: 4.2 mmol/L (ref 3.5–5.2)
SODIUM: 143 mmol/L (ref 134–144)
Total Protein: 6.7 g/dL (ref 6.0–8.5)

## 2014-01-07 LAB — NMR, LIPOPROFILE
Cholesterol: 133 mg/dL (ref <200)
HDL Cholesterol by NMR: 50 mg/dL (ref >=40)
HDL Particle Number: 30 umol/L — ABNORMAL LOW (ref >=30.5)
LDL PARTICLE NUMBER: 940 nmol/L (ref <1000)
LDL SIZE: 20.2 nm — AB (ref >20.5)
LDLC SERPL CALC-MCNC: 62 mg/dL (ref <100)
LP-IR Score: 50 — ABNORMAL HIGH (ref <=45)
Small LDL Particle Number: 545 nmol/L — ABNORMAL HIGH (ref <=527)
Triglycerides by NMR: 107 mg/dL (ref <150)

## 2014-01-09 ENCOUNTER — Telehealth: Payer: Self-pay | Admitting: Family Medicine

## 2014-01-09 NOTE — Telephone Encounter (Signed)
Note read to patient and she is satisfied.

## 2014-01-09 NOTE — Telephone Encounter (Signed)
Message copied by Waverly Ferrari on Mon Jan 09, 2014 10:11 AM ------      Message from: Chevis Pretty      Created: Mon Jan 09, 2014  7:05 AM       All labs look excellent! Continue all meds and recheck in 6 months ------

## 2014-03-16 ENCOUNTER — Ambulatory Visit (INDEPENDENT_AMBULATORY_CARE_PROVIDER_SITE_OTHER): Payer: BC Managed Care – PPO | Admitting: Cardiology

## 2014-03-16 ENCOUNTER — Encounter: Payer: Self-pay | Admitting: Cardiology

## 2014-03-16 VITALS — BP 112/80 | HR 53 | Ht 66.0 in | Wt 165.0 lb

## 2014-03-16 DIAGNOSIS — I493 Ventricular premature depolarization: Secondary | ICD-10-CM

## 2014-03-16 DIAGNOSIS — I471 Supraventricular tachycardia: Secondary | ICD-10-CM

## 2014-03-16 DIAGNOSIS — I10 Essential (primary) hypertension: Secondary | ICD-10-CM

## 2014-03-16 DIAGNOSIS — E785 Hyperlipidemia, unspecified: Secondary | ICD-10-CM

## 2014-03-16 DIAGNOSIS — I498 Other specified cardiac arrhythmias: Secondary | ICD-10-CM

## 2014-03-16 DIAGNOSIS — I4949 Other premature depolarization: Secondary | ICD-10-CM

## 2014-03-16 HISTORY — DX: Ventricular premature depolarization: I49.3

## 2014-03-16 MED ORDER — METOPROLOL TARTRATE 25 MG PO TABS: 12.5000 mg | ORAL_TABLET | Freq: Two times a day (BID) | ORAL | Status: DC

## 2014-03-16 NOTE — Patient Instructions (Signed)
Continue your current therapy and avoid caffeine.  I will see you in one year.

## 2014-03-16 NOTE — Progress Notes (Signed)
Jean [name] Date of Birth: 11/26/1951   History of Present Illness: Jean [name] is seen today for yearly followup. She has a history of SVT and PVCs. She reports she has done very well over the past year. She has minor infrequent palpitations described as skipping. No racing or sustained arrhythmia. Still drinking some caffeine.  Current Outpatient Prescriptions on File Prior to Visit  Medication Sig Dispense Refill  . aspirin 81 MG tablet Take 81 mg by mouth daily.        . Cholecalciferol (VITAMIN D-3 PO) Take by mouth daily.        Marland Kitchen ezetimibe (ZETIA) 10 MG tablet Take 1 tablet (10 mg total) by mouth daily.  90 tablet  1  . Fexofenadine HCl (ALLEGRA PO) Take by mouth.      . sertraline (ZOLOFT) 50 MG tablet Take 1 tablet (50 mg total) by mouth daily.  90 tablet  1  . simvastatin (ZOCOR) 20 MG tablet Take 1 tablet (20 mg total) by mouth daily at 6 PM.  90 tablet  1  . valsartan-hydrochlorothiazide (DIOVAN-HCT) 160-12.5 MG per tablet Take 1 tablet by mouth daily.  90 tablet  1   No current facility-administered medications on file prior to visit.    No Known Allergies  Past Medical History  Diagnosis Date  . SVT (supraventricular tachycardia)   . Hypertension   . Mitral insufficiency     TRIVIAL  . Hyperlipidemia   . Gestational diabetes     WITH HER CHILDBIRTH  . Palpitations   . PVC's (premature ventricular contractions) 03/16/2014    Past Surgical History  Procedure Laterality Date  . US echocardiography      EF 60%    History  Smoking status  . Never Smoker   Smokeless tobacco  . Not on file    History  Alcohol Use No    Family History  Problem Relation Age of Onset  . Heart disease Mother   . Hypertension Sister   . Diabetes Sister   . Anemia Paternal Grandmother     Review of Systems: As noted in HPI. All other systems were reviewed and are negative.  Physical Exam: BP 112/80  Pulse 53  Ht 5\' 6"  (1.676 m)  Wt 165 lb (74.844 kg)  BMI  26.64 kg/m2 She is a pleasant white female in no acute distress. HEENT exam is unremarkable. Neck is supple without JVD, adenopathy, thyromegaly, or bruits. Lungs are clear. Cardiac exam reveals a regular rate and rhythm. S1 is split. S2 is normal. There is no murmur or gallop. Abdomen is soft and nontender without masses or hepatosplenomegaly. Femoral and pedal pulses are 2+ and symmetric. She has no edema. Skin is warm and dry. She is alert and oriented x3. Cranial nerves II through XII are intact.  LABORATORY DATA: ECG today demonstrates sinus bradycardia with a rate of 53 beats per minute with occ PVCs. It is otherwise normal.  Lab Results  Component Value Date   WBC 6.1 07/08/2011   HGB 15.1* 07/08/2011   HCT 44.3 07/08/2011   PLT 174.0 07/08/2011   GLUCOSE 99 01/05/2014   CHOL 133 01/05/2014   ALT 24 01/05/2014   AST 20 01/05/2014   NA 143 01/05/2014   K 4.2 01/05/2014   CL 102 01/05/2014   CREATININE 0.76 01/05/2014   BUN 14 01/05/2014   CO2 25 01/05/2014   TSH 1.00 07/08/2011     Assessment / Plan: 1. Supraventricular tachycardia. Well controlled  on very low dose of metoprolol 12.5 mg bid. I don't think she would tolerate a higher dose due to bradycardia. We will continue her current therapy. I'll followup again in one year.  2. Hypertension, well controlled.  3. Hyperlipidemia. On Zetia and Zocor. Excellent lipoprotein analysis in Jan.

## 2014-04-01 ENCOUNTER — Other Ambulatory Visit: Payer: Self-pay | Admitting: Cardiology

## 2014-04-04 ENCOUNTER — Ambulatory Visit: Payer: BC Managed Care – PPO | Admitting: Cardiology

## 2014-06-21 ENCOUNTER — Other Ambulatory Visit: Payer: Self-pay | Admitting: Nurse Practitioner

## 2014-06-22 NOTE — Telephone Encounter (Signed)
Last seen 10/25/14 MMM 

## 2014-07-01 ENCOUNTER — Other Ambulatory Visit: Payer: Self-pay | Admitting: Nurse Practitioner

## 2014-07-02 ENCOUNTER — Other Ambulatory Visit: Payer: Self-pay | Admitting: Nurse Practitioner

## 2014-07-03 NOTE — Telephone Encounter (Signed)
Patient last seen in office on 01-04-14. Please advise for 90 day refills

## 2014-07-03 NOTE — Telephone Encounter (Signed)
Patient NTBS for follow up and lab work  

## 2014-09-30 ENCOUNTER — Other Ambulatory Visit: Payer: Self-pay | Admitting: Nurse Practitioner

## 2014-10-03 NOTE — Telephone Encounter (Signed)
no more refills without being seen  

## 2014-10-03 NOTE — Telephone Encounter (Signed)
Last seen 12/2013 

## 2014-12-24 ENCOUNTER — Other Ambulatory Visit: Payer: Self-pay | Admitting: Nurse Practitioner

## 2014-12-25 NOTE — Telephone Encounter (Signed)
no more refills without being seen  

## 2014-12-25 NOTE — Telephone Encounter (Signed)
Last seen 12/2013

## 2014-12-27 ENCOUNTER — Other Ambulatory Visit: Payer: Self-pay | Admitting: Nurse Practitioner

## 2015-01-21 ENCOUNTER — Other Ambulatory Visit: Payer: Self-pay | Admitting: Nurse Practitioner

## 2015-01-23 ENCOUNTER — Telehealth: Payer: Self-pay | Admitting: Nurse Practitioner

## 2015-01-29 ENCOUNTER — Other Ambulatory Visit: Payer: Self-pay | Admitting: Nurse Practitioner

## 2015-02-28 ENCOUNTER — Ambulatory Visit (INDEPENDENT_AMBULATORY_CARE_PROVIDER_SITE_OTHER): Payer: BC Managed Care – PPO | Admitting: Nurse Practitioner

## 2015-02-28 ENCOUNTER — Encounter (INDEPENDENT_AMBULATORY_CARE_PROVIDER_SITE_OTHER): Payer: Self-pay

## 2015-02-28 ENCOUNTER — Encounter: Payer: Self-pay | Admitting: Nurse Practitioner

## 2015-02-28 VITALS — BP 141/63 | HR 49 | Temp 97.3°F | Ht 66.0 in | Wt 159.0 lb

## 2015-02-28 DIAGNOSIS — E785 Hyperlipidemia, unspecified: Secondary | ICD-10-CM

## 2015-02-28 DIAGNOSIS — I471 Supraventricular tachycardia: Secondary | ICD-10-CM

## 2015-02-28 DIAGNOSIS — F32A Depression, unspecified: Secondary | ICD-10-CM

## 2015-02-28 DIAGNOSIS — F329 Major depressive disorder, single episode, unspecified: Secondary | ICD-10-CM

## 2015-02-28 DIAGNOSIS — I10 Essential (primary) hypertension: Secondary | ICD-10-CM

## 2015-02-28 MED ORDER — SERTRALINE HCL 50 MG PO TABS
ORAL_TABLET | ORAL | Status: DC
Start: 2015-02-28 — End: 2015-08-23

## 2015-02-28 MED ORDER — METOPROLOL TARTRATE 25 MG PO TABS: 12.5000 mg | ORAL_TABLET | Freq: Two times a day (BID) | ORAL | Status: DC

## 2015-02-28 MED ORDER — SIMVASTATIN 20 MG PO TABS
ORAL_TABLET | ORAL | Status: DC
Start: 2015-02-28 — End: 2015-08-23

## 2015-02-28 MED ORDER — EZETIMIBE 10 MG PO TABS: 10.0000 mg | ORAL_TABLET | Freq: Every day | ORAL | Status: DC

## 2015-02-28 MED ORDER — VALSARTAN-HYDROCHLOROTHIAZIDE 160-12.5 MG PO TABS: 1.0000 | ORAL_TABLET | Freq: Every day | ORAL | Status: DC

## 2015-02-28 NOTE — Progress Notes (Signed)
Subjective:    Patient ID: Jean [name], female    DOB: [birth year], 64 y.o.   MRN: [id number]  Hypertension This is a chronic problem. The current episode started more than 1 year ago. The problem is controlled. Pertinent negatives include no chest pain, headaches, neck pain, palpitations or shortness of breath. Risk factors for coronary artery disease include dyslipidemia and post-menopausal state. Past treatments include angiotensin blockers and diuretics. The current treatment provides moderate improvement. There are no compliance problems.  Hypertensive end-organ damage includes CAD/MI. There is no history of CVA, heart failure or PVD.  Hyperlipidemia This is a chronic problem. The current episode started more than 1 year ago. Recent lipid tests were reviewed and are variable. Pertinent negatives include no chest pain or shortness of breath. Current antihyperlipidemic treatment includes statins. The current treatment provides moderate improvement of lipids. There are no compliance problems.  Risk factors for coronary artery disease include dyslipidemia, hypertension and post-menopausal.  Depression Currently on zoloft- doing well- no side effects   Review of Systems  Respiratory: Negative for shortness of breath.   Cardiovascular: Negative for chest pain and palpitations.  Musculoskeletal: Negative for neck pain.  Neurological: Negative for headaches.  All other systems reviewed and are negative.      Objective:   Physical Exam  Constitutional: She is oriented to person, place, and time. She appears well-developed and well-nourished.  HENT:  Nose: Nose normal.  Mouth/Throat: Oropharynx is clear and moist.  Eyes: EOM are normal.  Neck: Trachea normal, normal range of motion and full passive range of motion without pain. Neck supple. No JVD present. Carotid bruit is not present. No thyromegaly present.  Cardiovascular: Normal rate, regular rhythm and intact distal pulses.  Exam  reveals gallop and S3. Exam reveals no friction rub.   No murmur heard. Pulmonary/Chest: Effort normal and breath sounds normal.  Abdominal: Soft. Bowel sounds are normal. She exhibits no distension and no mass. There is no tenderness.  Musculoskeletal: Normal range of motion.  Lymphadenopathy:    She has no cervical adenopathy.  Neurological: She is alert and oriented to person, place, and time. She has normal reflexes.  Skin: Skin is warm and dry.  Psychiatric: She has a normal mood and affect. Her behavior is normal. Judgment and thought content normal.   BP 141/63 mmHg  Pulse 49  Temp(Src) 97.3 F (36.3 C) (Oral)  Ht _0  (1.676 m)  Wt 159 lb (72.122 kg)  BMI 25.68 kg/m2        Assessment & Plan:   1. Essential hypertension Do not add salt to diet - valsartan-hydrochlorothiazide (DIOVAN-HCT) 160-12.5 MG per tablet; Take 1 tablet by mouth daily.  Dispense: 90 tablet; Refill: 1 - CMP14+EGFR  2. Hyperlipidemia Low fat diet - simvastatin (ZOCOR) 20 MG tablet; TAKE 1 TABLET (20 MG TOTAL) BY MOUTH DAILY AT 6 PM.  Dispense: 90 tablet; Refill: 1 - ezetimibe (ZETIA) 10 MG tablet; Take 1 tablet (10 mg total) by mouth daily.  Dispense: 90 tablet; Refill: 1 - NMR, lipoprofile  3. Depression Stress management - sertraline (ZOLOFT) 50 MG tablet; TAKE 1 TABLET (50 MG TOTAL) BY MOUTH DAILY.  Dispense: 90 tablet; Refill: 1  4. SVT (supraventricular tachycardia) Keep follow up with cardiologist - metoprolol tartrate (LOPRESSOR) 25 MG tablet; Take 0.5 tablets (12.5 mg total) by mouth 2 (two) times daily.  Dispense: 30 tablet; Refill: 11   hemoccult cards given to patient with directions Labs pending Health maintenance reviewed Diet and  exercise encouraged Continue all meds Follow up  In 6 month   [place], FNP

## 2015-02-28 NOTE — Patient Instructions (Signed)
Exercise to Stay Healthy Exercise helps you become and stay healthy. EXERCISE IDEAS AND TIPS Choose exercises that:  You enjoy.  Fit into your day. You do not need to exercise really hard to be healthy. You can do exercises at a slow or medium level and stay healthy. You can:  Stretch before and after working out.  Try yoga, Pilates, or tai chi.  Lift weights.  Walk fast, swim, jog, run, climb stairs, bicycle, dance, or rollerskate.  Take aerobic classes. Exercises that burn about 150 calories:  Running 1  miles in 15 minutes.  Playing volleyball for 45 to 60 minutes.  Washing and waxing a car for 45 to 60 minutes.  Playing touch football for 45 minutes.  Walking 1  miles in 35 minutes.  Pushing a stroller 1  miles in 30 minutes.  Playing basketball for 30 minutes.  Raking leaves for 30 minutes.  Bicycling 5 miles in 30 minutes.  Walking 2 miles in 30 minutes.  Dancing for 30 minutes.  Shoveling snow for 15 minutes.  Swimming laps for 20 minutes.  Walking up stairs for 15 minutes.  Bicycling 4 miles in 15 minutes.  Gardening for 30 to 45 minutes.  Jumping rope for 15 minutes.  Washing windows or floors for 45 to 60 minutes. Document Released: 01/10/2011 Document Revised: 03/01/2012 Document Reviewed: 01/10/2011 ExitCare Patient Information 2015 [employer]. This information is not intended to replace advice given to you by your health care provider. Make sure you discuss any questions you have with your health care provider.  

## 2015-03-01 LAB — NMR, LIPOPROFILE
CHOLESTEROL: 147 mg/dL (ref 100–199)
HDL Cholesterol by NMR: 41 mg/dL (ref >39)
HDL Particle Number: 29.3 umol/L — ABNORMAL LOW (ref >=30.5)
LDL Particle Number: 1191 nmol/L — ABNORMAL HIGH (ref <1000)
LDL SIZE: 20.4 nm (ref >20.5)
LDL-C: 86 mg/dL (ref 0–99)
LP-IR SCORE: 56 — AB (ref <=45)
SMALL LDL PARTICLE NUMBER: 706 nmol/L — AB (ref <=527)
Triglycerides by NMR: 100 mg/dL (ref 0–149)

## 2015-03-01 LAB — CMP14+EGFR
A/G RATIO: 1.5 (ref 1.1–2.5)
ALT: 64 IU/L — ABNORMAL HIGH (ref 0–32)
AST: 42 IU/L — ABNORMAL HIGH (ref 0–40)
Albumin: 4.1 g/dL (ref 3.6–4.8)
Alkaline Phosphatase: 85 IU/L (ref 39–117)
BUN / CREAT RATIO: 19 (ref 11–26)
BUN: 15 mg/dL (ref 8–27)
Bilirubin Total: 0.8 mg/dL (ref 0.0–1.2)
CO2: 26 mmol/L (ref 18–29)
CREATININE: 0.81 mg/dL (ref 0.57–1.00)
Calcium: 9.5 mg/dL (ref 8.7–10.3)
Chloride: 99 mmol/L (ref 97–108)
GFR calc non Af Amer: 77 mL/min/{1.73_m2} (ref >59)
GFR, EST AFRICAN AMERICAN: 89 mL/min/{1.73_m2} (ref >59)
GLOBULIN, TOTAL: 2.8 g/dL (ref 1.5–4.5)
Glucose: 104 mg/dL — ABNORMAL HIGH (ref 65–99)
Potassium: 4.4 mmol/L (ref 3.5–5.2)
Sodium: 142 mmol/L (ref 134–144)
Total Protein: 6.9 g/dL (ref 6.0–8.5)

## 2015-03-22 ENCOUNTER — Other Ambulatory Visit: Payer: Self-pay | Admitting: Nurse Practitioner

## 2015-04-02 ENCOUNTER — Ambulatory Visit (INDEPENDENT_AMBULATORY_CARE_PROVIDER_SITE_OTHER): Payer: Medicare PPO | Admitting: Cardiology

## 2015-04-02 ENCOUNTER — Encounter: Payer: Self-pay | Admitting: Cardiology

## 2015-04-02 VITALS — BP 164/80 | HR 48 | Ht 66.0 in | Wt 160.2 lb

## 2015-04-02 DIAGNOSIS — E785 Hyperlipidemia, unspecified: Secondary | ICD-10-CM | POA: Diagnosis not present

## 2015-04-02 DIAGNOSIS — I471 Supraventricular tachycardia: Secondary | ICD-10-CM

## 2015-04-02 DIAGNOSIS — I10 Essential (primary) hypertension: Secondary | ICD-10-CM | POA: Diagnosis not present

## 2015-04-02 DIAGNOSIS — I493 Ventricular premature depolarization: Secondary | ICD-10-CM

## 2015-04-02 MED ORDER — METOPROLOL TARTRATE 25 MG PO TABS: 12.5000 mg | ORAL_TABLET | Freq: Two times a day (BID) | ORAL | Status: DC

## 2015-04-02 NOTE — Patient Instructions (Signed)
Continue your current therapy  Let me know if your palpitations get worse ( more frequent or lasting longer )  I will see you in one year.

## 2015-04-02 NOTE — Progress Notes (Signed)
Jean [name] Date of Birth: 02/13/1951   History of Present Illness: Jean [name] is seen today for yearly followup. She has a history of SVT in the past and PVCs. She reports she has done very well over the past year. She states she does have some racing about one a month that may last up to one hour. It doesn't really bother her. No dizziness, syncope, chest pain, or SOB. Feels well overall. In our current records I do not see any documentation of SVT- apparently this was pre 2009. Event monitor in 2012 showed a lot of PVCs. Echo in 2012 showed MV redundancy without definite prolapse. Mild MR.   Current Outpatient Prescriptions on File Prior to Visit  Medication Sig Dispense Refill  . aspirin 81 MG tablet Take 81 mg by mouth daily.      Marland Kitchen ezetimibe (ZETIA) 10 MG tablet Take 1 tablet (10 mg total) by mouth daily. 90 tablet 1  . Fexofenadine HCl (ALLEGRA PO) Take by mouth.    . sertraline (ZOLOFT) 50 MG tablet TAKE 1 TABLET (50 MG TOTAL) BY MOUTH DAILY. 90 tablet 1  . simvastatin (ZOCOR) 20 MG tablet TAKE 1 TABLET (20 MG TOTAL) BY MOUTH DAILY AT 6 PM. 90 tablet 1  . valsartan-hydrochlorothiazide (DIOVAN-HCT) 160-12.5 MG per tablet Take 1 tablet by mouth daily. 90 tablet 1   No current facility-administered medications on file prior to visit.    No Known Allergies  Past Medical History  Diagnosis Date  . SVT (supraventricular tachycardia)   . Hypertension   . Mitral insufficiency     TRIVIAL  . Hyperlipidemia   . Gestational diabetes     WITH HER CHILDBIRTH  . Palpitations   . PVC's (premature ventricular contractions) 03/16/2014    Past Surgical History  Procedure Laterality Date  . US echocardiography      EF 60%    History  Smoking status  . Never Smoker   Smokeless tobacco  . Not on file    History  Alcohol Use No    Family History  Problem Relation Age of Onset  . Heart disease Mother   . Hypertension Sister   . Diabetes Sister   . Anemia Paternal  Grandmother     Review of Systems: As noted in HPI. All other systems were reviewed and are negative.  Physical Exam: BP 164/80 mmHg  Pulse 48  Ht 5\' 6"  (1.676 m)  Wt 160 lb 3.2 oz (72.666 kg)  BMI 25.87 kg/m2 She is a pleasant white female in no acute distress. HEENT exam is unremarkable. Neck is supple without JVD, adenopathy, thyromegaly, or bruits. Lungs are clear. Cardiac exam reveals a regular rate and rhythm. S1 and  S2 are normal. Positive click. There is no murmur or gallop. Abdomen is soft and nontender without masses or hepatosplenomegaly. Femoral and pedal pulses are 2+ and symmetric. She has no edema. Skin is warm and dry. She is alert and oriented x3. Cranial nerves II through XII are intact.  LABORATORY DATA: ECG today demonstrates sinus bradycardia with a rate of 48 beats per minute. It is otherwise normal.  Lab Results  Component Value Date   WBC 6.1 07/08/2011   HGB 15.1* 07/08/2011   HCT 44.3 07/08/2011   PLT 174.0 07/08/2011   GLUCOSE 104* 02/28/2015   CHOL 147 02/28/2015   TRIG 100 02/28/2015   HDL 41 02/28/2015   LDLCALC 62 01/05/2014   ALT 64* 02/28/2015   AST 42* 02/28/2015  NA 142 02/28/2015   K 4.4 02/28/2015   CL 99 02/28/2015   CREATININE 0.81 02/28/2015   BUN 15 02/28/2015   CO2 26 02/28/2015   TSH 1.00 07/08/2011     Assessment / Plan: 1. Supraventricular tachycardia. Well controlled on very low dose of metoprolol 12.5 mg bid. I don't think she would tolerate a higher dose due to bradycardia. She is satisfied with her current symptom control. We will continue her current therapy. I'll followup again in one year.  2. Hypertension, well controlled by her records. .  3. Hyperlipidemia. On Zetia and Zocor. Recent lipids satisfactory.

## 2015-05-02 ENCOUNTER — Other Ambulatory Visit: Payer: Self-pay | Admitting: Nurse Practitioner

## 2015-07-26 ENCOUNTER — Other Ambulatory Visit: Payer: Self-pay | Admitting: Nurse Practitioner

## 2015-07-26 NOTE — Telephone Encounter (Signed)
Last seen 02/28/15 MMM  Requesting 90 day supply  No upcoming appt

## 2015-08-01 ENCOUNTER — Other Ambulatory Visit: Payer: Self-pay | Admitting: Nurse Practitioner

## 2015-08-23 ENCOUNTER — Ambulatory Visit (INDEPENDENT_AMBULATORY_CARE_PROVIDER_SITE_OTHER): Payer: Medicare PPO

## 2015-08-23 ENCOUNTER — Encounter: Payer: Self-pay | Admitting: Nurse Practitioner

## 2015-08-23 ENCOUNTER — Other Ambulatory Visit: Payer: Self-pay | Admitting: Nurse Practitioner

## 2015-08-23 ENCOUNTER — Ambulatory Visit (INDEPENDENT_AMBULATORY_CARE_PROVIDER_SITE_OTHER): Payer: Medicare PPO | Admitting: Nurse Practitioner

## 2015-08-23 VITALS — BP 136/82 | HR 51 | Temp 97.4°F | Ht 66.0 in | Wt 164.0 lb

## 2015-08-23 DIAGNOSIS — Z13820 Encounter for screening for osteoporosis: Secondary | ICD-10-CM | POA: Diagnosis not present

## 2015-08-23 DIAGNOSIS — F32A Depression, unspecified: Secondary | ICD-10-CM

## 2015-08-23 DIAGNOSIS — F329 Major depressive disorder, single episode, unspecified: Secondary | ICD-10-CM | POA: Diagnosis not present

## 2015-08-23 DIAGNOSIS — Z6826 Body mass index (BMI) 26.0-26.9, adult: Secondary | ICD-10-CM | POA: Diagnosis not present

## 2015-08-23 DIAGNOSIS — I10 Essential (primary) hypertension: Secondary | ICD-10-CM | POA: Diagnosis not present

## 2015-08-23 DIAGNOSIS — Z780 Asymptomatic menopausal state: Secondary | ICD-10-CM | POA: Diagnosis not present

## 2015-08-23 DIAGNOSIS — Z23 Encounter for immunization: Secondary | ICD-10-CM | POA: Diagnosis not present

## 2015-08-23 DIAGNOSIS — E785 Hyperlipidemia, unspecified: Secondary | ICD-10-CM

## 2015-08-23 MED ORDER — SIMVASTATIN 20 MG PO TABS: ORAL_TABLET | ORAL | Status: DC

## 2015-08-23 MED ORDER — VALSARTAN-HYDROCHLOROTHIAZIDE 160-12.5 MG PO TABS: 1.0000 | ORAL_TABLET | Freq: Every day | ORAL | Status: DC

## 2015-08-23 MED ORDER — SERTRALINE HCL 50 MG PO TABS: ORAL_TABLET | ORAL | Status: DC

## 2015-08-23 MED ORDER — EZETIMIBE 10 MG PO TABS: 10.0000 mg | ORAL_TABLET | Freq: Every day | ORAL | Status: DC

## 2015-08-23 NOTE — Progress Notes (Signed)
Subjective:    Patient ID: Jean [name], female    DOB: [birth year], 64 y.o.   MRN: [id number]   Patient here today for follow up of chronic medical problems.   Hypertension This is a chronic problem. The current episode started more than 1 year ago. The problem is controlled. Pertinent negatives include no chest pain, headaches, neck pain, palpitations or shortness of breath. Risk factors for coronary artery disease include dyslipidemia and post-menopausal state. Past treatments include angiotensin blockers and diuretics. The current treatment provides moderate improvement. There are no compliance problems.  Hypertensive end-organ damage includes CAD/MI. There is no history of CVA, heart failure or PVD.  Hyperlipidemia This is a chronic problem. The current episode started more than 1 year ago. Recent lipid tests were reviewed and are variable. Pertinent negatives include no chest pain or shortness of breath. Current antihyperlipidemic treatment includes statins. The current treatment provides moderate improvement of lipids. There are no compliance problems.  Risk factors for coronary artery disease include dyslipidemia, hypertension and post-menopausal.  Depression Currently on zoloft- doing well- no side effects Hx SVT No recent episodes-currently on metoprolol     Review of Systems  Constitutional: Negative.   HENT: Negative.   Respiratory: Negative for shortness of breath.   Cardiovascular: Negative for chest pain and palpitations.  Genitourinary: Negative.   Musculoskeletal: Negative for neck pain.  Neurological: Negative for headaches.  Psychiatric/Behavioral: Negative.   All other systems reviewed and are negative.      Objective:   Physical Exam  Constitutional: She is oriented to person, place, and time. She appears well-developed and well-nourished.  HENT:  Nose: Nose normal.  Mouth/Throat: Oropharynx is clear and moist.  Eyes: EOM are normal.  Neck: Trachea  normal, normal range of motion and full passive range of motion without pain. Neck supple. No JVD present. Carotid bruit is not present. No thyromegaly present.  Cardiovascular: Normal rate, regular rhythm and intact distal pulses.  Exam reveals gallop and S3. Exam reveals no friction rub.   No murmur heard. Pulmonary/Chest: Effort normal and breath sounds normal.  Abdominal: Soft. Bowel sounds are normal. She exhibits no distension and no mass. There is no tenderness.  Musculoskeletal: Normal range of motion.  Lymphadenopathy:    She has no cervical adenopathy.  Neurological: She is alert and oriented to person, place, and time. She has normal reflexes.  Skin: Skin is warm and dry.  Psychiatric: She has a normal mood and affect. Her behavior is normal. Judgment and thought content normal.    BP 136/82 mmHg  Pulse 51  Temp(Src) 97.4 F (36.3 C) (Oral)  Ht $R'5\' 6"'uh$  (1.676 m)  Wt 164 lb (74.39 kg)  BMI 26.48 kg/m2   Chest x ray- no cardiopulmonary disease-Preliminary reading by Ronnald Collum, FNP  [facility]       Assessment & Plan:   1. Essential hypertension Do not add salt to diet - valsartan-hydrochlorothiazide (DIOVAN-HCT) 160-12.5 MG per tablet; Take 1 tablet by mouth daily.  Dispense: 90 tablet; Refill: 1 - CMP14+EGFR - DG Chest 2 View; Future  2. Hyperlipidemia Low fat diet - ezetimibe (ZETIA) 10 MG tablet; Take 1 tablet (10 mg total) by mouth daily.  Dispense: 90 tablet; Refill: 1 - simvastatin (ZOCOR) 20 MG tablet; TAKE 1 TABLET (20 MG TOTAL) BY MOUTH DAILY AT 6 PM.  Dispense: 90 tablet; Refill: 1 - Lipid panel  3. Depression stress mangement - sertraline (ZOLOFT) 50 MG tablet; TAKE 1 TABLET (50 MG TOTAL) BY  MOUTH DAILY.  Dispense: 90 tablet; Refill: 1  4. BMI 26.0-26.9,adult Discussed diet and exercise for person with BMI >25 Will recheck weight in 3-6 months   5. Screening for osteoporosis - DG Bone Density; Future    Labs pending Health maintenance  reviewed Diet and exercise encouraged Continue all meds Follow up  In 3 months    [place], FNP

## 2015-08-23 NOTE — Patient Instructions (Signed)
Fat and Cholesterol Control Diet Fat and cholesterol levels in your blood and organs are influenced by your diet. High levels of fat and cholesterol may lead to diseases of the heart, small and large blood vessels, gallbladder, liver, and pancreas. CONTROLLING FAT AND CHOLESTEROL WITH DIET Although exercise and lifestyle factors are important, your diet is key. That is because certain foods are known to raise cholesterol and others to lower it. The goal is to balance foods for their effect on cholesterol and more importantly, to replace saturated and trans fat with other types of fat, such as monounsaturated fat, polyunsaturated fat, and omega-3 fatty acids. On average, a person should consume no more than 15 to 17 g of saturated fat daily. Saturated and trans fats are considered "bad" fats, and they will raise LDL cholesterol. Saturated fats are primarily found in animal products such as meats, butter, and cream. However, that does not mean you need to give up all your favorite foods. Today, there are good tasting, low-fat, low-cholesterol substitutes for most of the things you like to eat. Choose low-fat or nonfat alternatives. Choose round or loin cuts of red meat. These types of cuts are lowest in fat and cholesterol. Chicken (without the skin), fish, veal, and ground turkey breast are great choices. Eliminate fatty meats, such as hot dogs and salami. Even shellfish have little or no saturated fat. Have a 3 oz (85 g) portion when you eat lean meat, poultry, or fish. Trans fats are also called "partially hydrogenated oils." They are oils that have been scientifically manipulated so that they are solid at room temperature resulting in a longer shelf life and improved taste and texture of foods in which they are added. Trans fats are found in stick margarine, some tub margarines, cookies, crackers, and baked goods.  When baking and cooking, oils are a great substitute for butter. The monounsaturated oils are  especially beneficial since it is believed they lower LDL and raise HDL. The oils you should avoid entirely are saturated tropical oils, such as coconut and palm.  Remember to eat a lot from food groups that are naturally free of saturated and trans fat, including fish, fruit, vegetables, beans, grains (barley, rice, couscous, bulgur wheat), and pasta (without cream sauces).  IDENTIFYING FOODS THAT LOWER FAT AND CHOLESTEROL  Soluble fiber may lower your cholesterol. This type of fiber is found in fruits such as apples, vegetables such as broccoli, potatoes, and carrots, legumes such as beans, peas, and lentils, and grains such as barley. Foods fortified with plant sterols (phytosterol) may also lower cholesterol. You should eat at least 2 g per day of these foods for a cholesterol lowering effect.  Read package labels to identify low-saturated fats, trans fat free, and low-fat foods at the supermarket. Select cheeses that have only 2 to 3 g saturated fat per ounce. Use a heart-healthy tub margarine that is free of trans fats or partially hydrogenated oil. When buying baked goods (cookies, crackers), avoid partially hydrogenated oils. Breads and muffins should be made from whole grains (whole-wheat or whole oat flour, instead of "flour" or "enriched flour"). Buy non-creamy canned soups with reduced salt and no added fats.  FOOD PREPARATION TECHNIQUES  Never deep-fry. If you must fry, either stir-fry, which uses very little fat, or use non-stick cooking sprays. When possible, broil, bake, or roast meats, and steam vegetables. Instead of putting butter or margarine on vegetables, use lemon and herbs, applesauce, and cinnamon (for squash and sweet potatoes). Use nonfat   yogurt, salsa, and low-fat dressings for salads.  LOW-SATURATED FAT / LOW-FAT FOOD SUBSTITUTES Meats / Saturated Fat (g)  Avoid: Steak, marbled (3 oz/85 g) / 11 g  Choose: Steak, lean (3 oz/85 g) / 4 g  Avoid: Hamburger (3 oz/85 g) / 7  g  Choose: Hamburger, lean (3 oz/85 g) / 5 g  Avoid: Ham (3 oz/85 g) / 6 g  Choose: Ham, lean cut (3 oz/85 g) / 2.4 g  Avoid: Chicken, with skin, dark meat (3 oz/85 g) / 4 g  Choose: Chicken, skin removed, dark meat (3 oz/85 g) / 2 g  Avoid: Chicken, with skin, light meat (3 oz/85 g) / 2.5 g  Choose: Chicken, skin removed, light meat (3 oz/85 g) / 1 g Dairy / Saturated Fat (g)  Avoid: Whole milk (1 cup) / 5 g  Choose: Low-fat milk, 2% (1 cup) / 3 g  Choose: Low-fat milk, 1% (1 cup) / 1.5 g  Choose: Skim milk (1 cup) / 0.3 g  Avoid: Hard cheese (1 oz/28 g) / 6 g  Choose: Skim milk cheese (1 oz/28 g) / 2 to 3 g  Avoid: Cottage cheese, 4% fat (1 cup) / 6.5 g  Choose: Low-fat cottage cheese, 1% fat (1 cup) / 1.5 g  Avoid: Ice cream (1 cup) / 9 g  Choose: Sherbet (1 cup) / 2.5 g  Choose: Nonfat frozen yogurt (1 cup) / 0.3 g  Choose: Frozen fruit bar / trace  Avoid: Whipped cream (1 tbs) / 3.5 g  Choose: Nondairy whipped topping (1 tbs) / 1 g Condiments / Saturated Fat (g)  Avoid: Mayonnaise (1 tbs) / 2 g  Choose: Low-fat mayonnaise (1 tbs) / 1 g  Avoid: Butter (1 tbs) / 7 g  Choose: Extra light margarine (1 tbs) / 1 g  Avoid: Coconut oil (1 tbs) / 11.8 g  Choose: Olive oil (1 tbs) / 1.8 g  Choose: Corn oil (1 tbs) / 1.7 g  Choose: Safflower oil (1 tbs) / 1.2 g  Choose: Sunflower oil (1 tbs) / 1.4 g  Choose: Soybean oil (1 tbs) / 2.4 g  Choose: Canola oil (1 tbs) / 1 g Document Released: 12/08/2005 Document Revised: 04/04/2013 Document Reviewed: 03/08/2014 [employer] Patient Information 2015 [employer]. This information is not intended to replace advice given to you by your health care provider. Make sure you discuss any questions you have with your health care provider.  

## 2015-08-23 NOTE — Addendum Note (Signed)
Addended by: Rolena Infante on: 08/23/2015 03:46 PM   Modules accepted: Orders

## 2015-08-24 LAB — CMP14+EGFR
ALBUMIN: 4.4 g/dL (ref 3.6–4.8)
ALK PHOS: 87 IU/L (ref 39–117)
ALT: 26 IU/L (ref 0–32)
AST: 26 IU/L (ref 0–40)
Albumin/Globulin Ratio: 1.5 (ref 1.1–2.5)
BILIRUBIN TOTAL: 0.6 mg/dL (ref 0.0–1.2)
BUN / CREAT RATIO: 25 (ref 11–26)
BUN: 18 mg/dL (ref 8–27)
CHLORIDE: 99 mmol/L (ref 97–108)
CO2: 26 mmol/L (ref 18–29)
Calcium: 9.3 mg/dL (ref 8.7–10.3)
Creatinine, Ser: 0.73 mg/dL (ref 0.57–1.00)
GFR calc Af Amer: 100 mL/min/{1.73_m2} (ref >59)
GFR calc non Af Amer: 87 mL/min/{1.73_m2} (ref >59)
GLOBULIN, TOTAL: 2.9 g/dL (ref 1.5–4.5)
GLUCOSE: 99 mg/dL (ref 65–99)
Potassium: 4 mmol/L (ref 3.5–5.2)
SODIUM: 140 mmol/L (ref 134–144)
Total Protein: 7.3 g/dL (ref 6.0–8.5)

## 2015-08-24 LAB — LIPID PANEL
CHOLESTEROL TOTAL: 143 mg/dL (ref 100–199)
Chol/HDL Ratio: 2.8 ratio units (ref 0.0–4.4)
HDL: 52 mg/dL (ref >39)
LDL Calculated: 69 mg/dL (ref 0–99)
Triglycerides: 110 mg/dL (ref 0–149)
VLDL Cholesterol Cal: 22 mg/dL (ref 5–40)

## 2015-08-27 ENCOUNTER — Other Ambulatory Visit: Payer: Self-pay | Admitting: Nurse Practitioner

## 2015-09-14 ENCOUNTER — Telehealth: Payer: Self-pay | Admitting: Nurse Practitioner

## 2015-09-14 NOTE — Telephone Encounter (Signed)
Patient aware of results.

## 2016-02-04 ENCOUNTER — Telehealth: Payer: Self-pay | Admitting: Nurse Practitioner

## 2016-02-04 NOTE — Telephone Encounter (Signed)
Denied.

## 2016-02-27 ENCOUNTER — Ambulatory Visit (INDEPENDENT_AMBULATORY_CARE_PROVIDER_SITE_OTHER): Payer: Medicare Other | Admitting: Family Medicine

## 2016-02-27 ENCOUNTER — Encounter: Payer: Self-pay | Admitting: Family Medicine

## 2016-02-27 VITALS — BP 164/81 | HR 79 | Temp 101.6°F | Ht 66.0 in | Wt 159.6 lb

## 2016-02-27 DIAGNOSIS — R509 Fever, unspecified: Secondary | ICD-10-CM | POA: Diagnosis not present

## 2016-02-27 DIAGNOSIS — R6883 Chills (without fever): Secondary | ICD-10-CM

## 2016-02-27 DIAGNOSIS — J111 Influenza due to unidentified influenza virus with other respiratory manifestations: Secondary | ICD-10-CM | POA: Diagnosis not present

## 2016-02-27 LAB — VERITOR FLU A/B WAIVED
Influenza A: POSITIVE — AB
Influenza B: NEGATIVE

## 2016-02-27 MED ORDER — OSELTAMIVIR PHOSPHATE 75 MG PO CAPS: 75.0000 mg | ORAL_CAPSULE | Freq: Two times a day (BID) | ORAL | Status: DC

## 2016-02-27 NOTE — Progress Notes (Signed)
BP 164/81 mmHg  Pulse 79  Temp(Src) 101.6 F (38.7 C) (Oral)  Ht 5\' 6"  (1.676 m)  Wt 159 lb 9.6 oz (72.394 kg)  BMI 25.77 kg/m2   Subjective:    Patient ID: Jean [name], female    DOB: 04-08-1951, 65 y.o.   MRN: [id number]  HPI: Jean [name] is a 65 y.o. female presenting on 02/27/2016 for Cough and Chills   HPI Cough and chills and fever Patient has been having cough and fever and chills for the past 2 days. She has had a month of cough before that that was nonproductive but the last 2 days it has been productive. She denies any shortness of breath or wheezing. Cough is productive of yellow-green sputum. She has been using Vicks and Coricidin.  Relevant past medical, surgical, family and social history reviewed and updated as indicated. Interim medical history since our last visit reviewed. Allergies and medications reviewed and updated.  Review of Systems  Constitutional: Positive for fever and chills.  HENT: Positive for congestion, postnasal drip, rhinorrhea, sinus pressure and sore throat. Negative for ear discharge, ear pain and sneezing.   Eyes: Negative for pain, redness and visual disturbance.  Respiratory: Positive for cough. Negative for chest tightness and shortness of breath.   Cardiovascular: Negative for chest pain and leg swelling.  Genitourinary: Negative for dysuria and difficulty urinating.  Musculoskeletal: Positive for myalgias. Negative for back pain and gait problem.  Skin: Negative for rash.  Neurological: Negative for light-headedness and headaches.  Psychiatric/Behavioral: Negative for behavioral problems and agitation.  All other systems reviewed and are negative.   Per HPI unless specifically indicated above     Medication List       This list is accurate as of: 02/27/16  4:31 PM.  Always use your most recent med list.               ALLEGRA PO  Take by mouth.     aspirin 81 MG tablet  Take 81 mg by mouth daily.     ezetimibe  10 MG tablet  Commonly known as:  ZETIA  Take 1 tablet (10 mg total) by mouth daily.     metoprolol tartrate 25 MG tablet  Commonly known as:  LOPRESSOR  Take 0.5 tablets (12.5 mg total) by mouth 2 (two) times daily.     oseltamivir 75 MG capsule  Commonly known as:  TAMIFLU  Take 1 capsule (75 mg total) by mouth 2 (two) times daily.     sertraline 50 MG tablet  Commonly known as:  ZOLOFT  TAKE 1 TABLET (50 MG TOTAL) BY MOUTH DAILY.     simvastatin 20 MG tablet  Commonly known as:  ZOCOR  TAKE 1 TABLET (20 MG TOTAL) BY MOUTH DAILY AT 6 PM.     valsartan-hydrochlorothiazide 160-12.5 MG tablet  Commonly known as:  DIOVAN-HCT  Take 1 tablet by mouth daily.           Objective:    BP 164/81 mmHg  Pulse 79  Temp(Src) 101.6 F (38.7 C) (Oral)  Ht 5\' 6"  (1.676 m)  Wt 159 lb 9.6 oz (72.394 kg)  BMI 25.77 kg/m2  Wt Readings from Last 3 Encounters:  02/27/16 159 lb 9.6 oz (72.394 kg)  08/23/15 164 lb (74.39 kg)  04/02/15 160 lb 3.2 oz (72.666 kg)    Physical Exam  Constitutional: She is oriented to person, place, and time. She appears well-developed and well-nourished. No distress.  HENT:  Right Ear: Tympanic membrane, external ear and ear canal normal.  Left Ear: Tympanic membrane, external ear and ear canal normal.  Nose: Mucosal edema and rhinorrhea present. No epistaxis. Right sinus exhibits no maxillary sinus tenderness and no frontal sinus tenderness. Left sinus exhibits no maxillary sinus tenderness and no frontal sinus tenderness.  Mouth/Throat: Uvula is midline and mucous membranes are normal. Posterior oropharyngeal edema and posterior oropharyngeal erythema present. No oropharyngeal exudate or tonsillar abscesses.  Eyes: Conjunctivae and EOM are normal.  Cardiovascular: Normal rate, regular rhythm, normal heart sounds and intact distal pulses.   No murmur heard. Pulmonary/Chest: Effort normal and breath sounds normal. No respiratory distress. She has no wheezes.    Musculoskeletal: Normal range of motion. She exhibits no edema or tenderness.  Neurological: She is alert and oriented to person, place, and time. Coordination normal.  Skin: Skin is warm and dry. No rash noted. She is not diaphoretic.  Psychiatric: She has a normal mood and affect. Her behavior is normal.  Vitals reviewed.     Assessment & Plan:       Problem List Items Addressed This Visit    None    Visit Diagnoses    Fever, unspecified fever cause    -  Primary    Relevant Orders    Veritor Flu A/B Waived    Chills        Relevant Orders    Veritor Flu A/B Waived    Influenza with respiratory manifestation        Relevant Orders    Veritor Flu A/B Waived        Follow up plan: Return if symptoms worsen or fail to improve.  Counseling provided for all of the vaccine components Orders Placed This Encounter  Procedures  . Veritor Flu A/B [place], MD [facility] 02/27/2016, 4:31 PM

## 2016-03-05 ENCOUNTER — Ambulatory Visit (INDEPENDENT_AMBULATORY_CARE_PROVIDER_SITE_OTHER): Payer: Medicare Other | Admitting: Family Medicine

## 2016-03-05 ENCOUNTER — Encounter: Payer: Self-pay | Admitting: Family Medicine

## 2016-03-05 VITALS — BP 131/72 | HR 56 | Temp 99.1°F | Ht 66.0 in | Wt 158.0 lb

## 2016-03-05 DIAGNOSIS — J209 Acute bronchitis, unspecified: Secondary | ICD-10-CM

## 2016-03-05 MED ORDER — FLUTICASONE PROPIONATE 50 MCG/ACT NA SUSP: 1.0000 | Freq: Two times a day (BID) | NASAL | Status: DC | PRN

## 2016-03-05 MED ORDER — AZITHROMYCIN 250 MG PO TABS: ORAL_TABLET | ORAL | Status: DC

## 2016-03-05 NOTE — Progress Notes (Signed)
BP 131/72 mmHg  Pulse 56  Temp(Src) 99.1 F (37.3 C) (Oral)  Ht 5\' 6"  (1.676 m)  Wt 158 lb (71.668 kg)  BMI 25.51 kg/m2   Subjective:    Patient ID: Jean [name], female    DOB: [birth year], 65 y.o.   MRN: [id number]  HPI: Jean [name] is a 65 y.o. female presenting on 03/05/2016 for Cough and chest congestion   HPI Cough and chest congestion Patient was here on 02/27/2016 and was diagnosed with influenza and given Tamiflu and was improving from that but now she feels like the cough is starting to get worse and more productive again and she is having a lot more sinus pressure and just cannot get rid of this cold. She denies any further fevers or chills since that first part. She denies any shortness of breath or wheezing. She does feel like she does have chest congestion as well though. Her cough is productive of yellow-green sputum  Relevant past medical, surgical, family and social history reviewed and updated as indicated. Interim medical history since our last visit reviewed. Allergies and medications reviewed and updated.  Review of Systems  Constitutional: Negative for fever and chills.  HENT: Positive for congestion, postnasal drip, rhinorrhea, sinus pressure, sneezing and sore throat. Negative for ear discharge and ear pain.   Eyes: Negative for pain, redness and visual disturbance.  Respiratory: Positive for cough. Negative for chest tightness and shortness of breath.   Cardiovascular: Negative for chest pain and leg swelling.  Genitourinary: Negative for dysuria and difficulty urinating.  Musculoskeletal: Negative for back pain and gait problem.  Skin: Negative for rash.  Neurological: Negative for light-headedness and headaches.  Psychiatric/Behavioral: Negative for behavioral problems and agitation.  All other systems reviewed and are negative.   Per HPI unless specifically indicated above     Medication List       This list is accurate as of: 03/05/16   3:29 PM.  Always use your most recent med list.               ALLEGRA PO  Take by mouth.     aspirin 81 MG tablet  Take 81 mg by mouth daily.     azithromycin 250 MG tablet  Commonly known as:  ZITHROMAX  Take 2 the first day and then one each day after.     ezetimibe 10 MG tablet  Commonly known as:  ZETIA  Take 1 tablet (10 mg total) by mouth daily.     fluticasone 50 MCG/ACT nasal spray  Commonly known as:  FLONASE  Place 1 spray into both nostrils 2 (two) times daily as needed for allergies or rhinitis.     metoprolol tartrate 25 MG tablet  Commonly known as:  LOPRESSOR  Take 0.5 tablets (12.5 mg total) by mouth 2 (two) times daily.     sertraline 50 MG tablet  Commonly known as:  ZOLOFT  TAKE 1 TABLET (50 MG TOTAL) BY MOUTH DAILY.     simvastatin 20 MG tablet  Commonly known as:  ZOCOR  TAKE 1 TABLET (20 MG TOTAL) BY MOUTH DAILY AT 6 PM.     valsartan-hydrochlorothiazide 160-12.5 MG tablet  Commonly known as:  DIOVAN-HCT  Take 1 tablet by mouth daily.           Objective:    BP 131/72 mmHg  Pulse 56  Temp(Src) 99.1 F (37.3 C) (Oral)  Ht 5\' 6"  (1.676 m)  Wt 158 lb (71.668 kg)  BMI 25.51 kg/m2  Wt Readings from Last 3 Encounters:  03/05/16 158 lb (71.668 kg)  02/27/16 159 lb 9.6 oz (72.394 kg)  08/23/15 164 lb (74.39 kg)    Physical Exam  Constitutional: She is oriented to person, place, and time. She appears well-developed and well-nourished. No distress.  HENT:  Right Ear: Tympanic membrane, external ear and ear canal normal.  Left Ear: Tympanic membrane, external ear and ear canal normal.  Nose: Mucosal edema and rhinorrhea present. No epistaxis. Right sinus exhibits no maxillary sinus tenderness and no frontal sinus tenderness. Left sinus exhibits no maxillary sinus tenderness and no frontal sinus tenderness.  Mouth/Throat: Uvula is midline and mucous membranes are normal. Posterior oropharyngeal edema and posterior oropharyngeal erythema  present. No oropharyngeal exudate or tonsillar abscesses.  Eyes: Conjunctivae and EOM are normal.  Cardiovascular: Normal rate, regular rhythm, normal heart sounds and intact distal pulses.   No murmur heard. Pulmonary/Chest: Effort normal and breath sounds normal. No respiratory distress. She has no wheezes. She has no rales. She exhibits no tenderness.  Musculoskeletal: Normal range of motion. She exhibits no edema or tenderness.  Neurological: She is alert and oriented to person, place, and time. Coordination normal.  Skin: Skin is warm and dry. No rash noted. She is not diaphoretic.  Psychiatric: She has a normal mood and affect. Her behavior is normal.  Vitals reviewed.   Results for orders placed or performed in visit on 02/27/16  Veritor Flu A/B Waived  Result Value Ref Range   Influenza A Positive (A) Negative   Influenza B Negative Negative      Assessment & Plan:       Problem List Items Addressed This Visit    None    Visit Diagnoses    Acute bronchitis, unspecified organism    -  Primary    Relevant Medications    fluticasone (FLONASE) 50 MCG/ACT nasal spray    azithromycin (ZITHROMAX) 250 MG tablet        Follow up plan: Return if symptoms worsen or fail to improve.  Counseling provided for all of the vaccine components No orders of the defined types were placed in this encounter.    Caryl Pina, MD [facility] 03/05/2016, 3:29 PM

## 2016-03-13 ENCOUNTER — Other Ambulatory Visit: Payer: Self-pay | Admitting: Nurse Practitioner

## 2016-04-01 ENCOUNTER — Encounter: Payer: Self-pay | Admitting: Cardiology

## 2016-04-01 ENCOUNTER — Ambulatory Visit (INDEPENDENT_AMBULATORY_CARE_PROVIDER_SITE_OTHER): Payer: Medicare Other | Admitting: Cardiology

## 2016-04-01 VITALS — BP 118/70 | HR 52 | Ht 66.0 in | Wt 158.6 lb

## 2016-04-01 DIAGNOSIS — I471 Supraventricular tachycardia: Secondary | ICD-10-CM

## 2016-04-01 DIAGNOSIS — I493 Ventricular premature depolarization: Secondary | ICD-10-CM

## 2016-04-01 DIAGNOSIS — I10 Essential (primary) hypertension: Secondary | ICD-10-CM | POA: Diagnosis not present

## 2016-04-01 MED ORDER — METOPROLOL TARTRATE 25 MG PO TABS: 12.5000 mg | ORAL_TABLET | Freq: Two times a day (BID) | ORAL | Status: DC

## 2016-04-01 NOTE — Addendum Note (Signed)
Addended by: Kathyrn Lass on: 04/01/2016 03:51 PM   Modules accepted: Orders

## 2016-04-01 NOTE — Progress Notes (Signed)
Jean [name] Date of Birth: 1951/07/19   History of Present Illness: Jean [name] is seen today for yearly followup. She has a history of SVT in the past and PVCs. She reports she has done very well over the past year. She states she does have some irregular heart beat about every other month lasting from 30 minutes to 2 hours. It doesn't really bother her. No dizziness, syncope, chest pain, or SOB. Feels well overall.   Current Outpatient Prescriptions on File Prior to Visit  Medication Sig Dispense Refill  . aspirin 81 MG tablet Take 81 mg by mouth daily.      Marland Kitchen ezetimibe (ZETIA) 10 MG tablet Take 1 tablet (10 mg total) by mouth daily. 90 tablet 1  . Fexofenadine HCl (ALLEGRA PO) Take by mouth.    . sertraline (ZOLOFT) 50 MG tablet TAKE 1 TABLET (50 MG TOTAL) BY MOUTH DAILY. 90 tablet 1  . simvastatin (ZOCOR) 20 MG tablet TAKE 1 TABLET (20 MG TOTAL) BY MOUTH DAILY AT 6 PM. 90 tablet 1  . valsartan-hydrochlorothiazide (DIOVAN-HCT) 160-12.5 MG tablet TAKE 1 TABLET BY MOUTH DAILY. 90 tablet 1   No current facility-administered medications on file prior to visit.    No Known Allergies  Past Medical History  Diagnosis Date  . SVT (supraventricular tachycardia) ([place])   . Hypertension   . Mitral insufficiency     TRIVIAL  . Hyperlipidemia   . Gestational diabetes     WITH HER CHILDBIRTH  . Palpitations   . PVC's (premature ventricular contractions) 03/16/2014    Past Surgical History  Procedure Laterality Date  . US echocardiography      EF 60%    History  Smoking status  . Never Smoker   Smokeless tobacco  . Never Used    History  Alcohol Use No    Family History  Problem Relation Age of Onset  . Heart disease Mother   . Hypertension Sister   . Diabetes Sister   . Anemia Paternal Grandmother     Review of Systems: As noted in HPI. All other systems were reviewed and are negative.  Physical Exam: BP 118/70 mmHg  Pulse 52  Ht 5\' 6"  (1.676 m)  Wt 71.94  kg (158 lb 9.6 oz)  BMI 25.61 kg/m2 She is a pleasant white female in no acute distress. HEENT exam is unremarkable. Neck is supple without JVD, adenopathy, thyromegaly, or bruits. Lungs are clear. Cardiac exam reveals a regular rate and rhythm. S1 and  S2 are normal. Positive click. There is no murmur or gallop. Abdomen is soft and nontender without masses or hepatosplenomegaly. Femoral and pedal pulses are 2+ and symmetric. She has no edema. Skin is warm and dry. She is alert and oriented x3. Cranial nerves II through XII are intact.  LABORATORY DATA: ECG today demonstrates sinus bradycardia with a rate of 52 beats per minute. It is otherwise normal. I have personally reviewed and interpreted this study.   Lab Results  Component Value Date   WBC 6.1 07/08/2011   HGB 15.1* 07/08/2011   HCT 44.3 07/08/2011   PLT 174.0 07/08/2011   GLUCOSE 99 08/23/2015   CHOL 143 08/23/2015   TRIG 110 08/23/2015   HDL 52 08/23/2015   LDLCALC 69 08/23/2015   ALT 26 08/23/2015   AST 26 08/23/2015   NA 140 08/23/2015   K 4.0 08/23/2015   CL 99 08/23/2015   CREATININE 0.73 08/23/2015   BUN 18 08/23/2015   CO2  26 08/23/2015   TSH 1.00 07/08/2011     Assessment / Plan: 1. Supraventricular tachycardia. Well controlled on very low dose of metoprolol 12.5 mg bid. I don't think she would tolerate a higher dose due to bradycardia.  We will continue her current therapy. I'll followup again in one year.  2. Hypertension, well controlled   3. Hyperlipidemia. On Zetia and Zocor. Recent lipids look good

## 2016-04-01 NOTE — Patient Instructions (Signed)
Continue your current therapy  I will see you in one year   

## 2016-04-07 ENCOUNTER — Other Ambulatory Visit: Payer: Self-pay | Admitting: *Deleted

## 2016-04-07 DIAGNOSIS — I471 Supraventricular tachycardia: Secondary | ICD-10-CM

## 2016-04-07 MED ORDER — METOPROLOL TARTRATE 25 MG PO TABS: 12.5000 mg | ORAL_TABLET | Freq: Two times a day (BID) | ORAL | Status: DC

## 2016-05-03 ENCOUNTER — Other Ambulatory Visit: Payer: Self-pay | Admitting: Nurse Practitioner

## 2016-05-07 ENCOUNTER — Other Ambulatory Visit: Payer: Self-pay | Admitting: Nurse Practitioner

## 2016-05-12 ENCOUNTER — Other Ambulatory Visit: Payer: Self-pay | Admitting: Nurse Practitioner

## 2016-05-12 NOTE — Telephone Encounter (Signed)
Last seen 03/05/16 Dr Dettinger  Last lipid 08/23/15

## 2016-05-16 ENCOUNTER — Ambulatory Visit (INDEPENDENT_AMBULATORY_CARE_PROVIDER_SITE_OTHER): Payer: Medicare Other | Admitting: Nurse Practitioner

## 2016-05-16 ENCOUNTER — Encounter: Payer: Self-pay | Admitting: Nurse Practitioner

## 2016-05-16 VITALS — BP 127/74 | HR 49 | Temp 98.0°F | Ht 66.0 in | Wt 158.0 lb

## 2016-05-16 DIAGNOSIS — F329 Major depressive disorder, single episode, unspecified: Secondary | ICD-10-CM | POA: Diagnosis not present

## 2016-05-16 DIAGNOSIS — Z1159 Encounter for screening for other viral diseases: Secondary | ICD-10-CM

## 2016-05-16 DIAGNOSIS — I10 Essential (primary) hypertension: Secondary | ICD-10-CM

## 2016-05-16 DIAGNOSIS — Z1212 Encounter for screening for malignant neoplasm of rectum: Secondary | ICD-10-CM | POA: Diagnosis not present

## 2016-05-16 DIAGNOSIS — I493 Ventricular premature depolarization: Secondary | ICD-10-CM | POA: Diagnosis not present

## 2016-05-16 DIAGNOSIS — Z6826 Body mass index (BMI) 26.0-26.9, adult: Secondary | ICD-10-CM

## 2016-05-16 DIAGNOSIS — I471 Supraventricular tachycardia: Secondary | ICD-10-CM

## 2016-05-16 DIAGNOSIS — F32A Depression, unspecified: Secondary | ICD-10-CM

## 2016-05-16 DIAGNOSIS — E785 Hyperlipidemia, unspecified: Secondary | ICD-10-CM

## 2016-05-16 MED ORDER — VALSARTAN-HYDROCHLOROTHIAZIDE 160-12.5 MG PO TABS: 1.0000 | ORAL_TABLET | Freq: Every day | ORAL | Status: DC

## 2016-05-16 MED ORDER — METOPROLOL TARTRATE 25 MG PO TABS: 12.5000 mg | ORAL_TABLET | Freq: Two times a day (BID) | ORAL | Status: DC

## 2016-05-16 MED ORDER — SIMVASTATIN 20 MG PO TABS: ORAL_TABLET | ORAL | Status: DC

## 2016-05-16 MED ORDER — EZETIMIBE 10 MG PO TABS: 10.0000 mg | ORAL_TABLET | Freq: Every day | ORAL | Status: DC

## 2016-05-16 MED ORDER — SERTRALINE HCL 50 MG PO TABS: ORAL_TABLET | ORAL | Status: DC

## 2016-05-16 NOTE — Patient Instructions (Signed)
Health Maintenance, Female Adopting a healthy lifestyle and getting preventive care can go a long way to promote health and wellness. Talk with your health care provider about what schedule of regular examinations is right for you. This is a good chance for you to check in with your provider about disease prevention and staying healthy. In between checkups, there are plenty of things you can do on your own. Experts have done a lot of research about which lifestyle changes and preventive measures are most likely to keep you healthy. Ask your health care provider for more information. WEIGHT AND DIET  Eat a healthy diet  Be sure to include plenty of vegetables, fruits, low-fat dairy products, and lean protein.  Do not eat a lot of foods high in solid fats, added sugars, or salt.  Get regular exercise. This is one of the most important things you can do for your health.  Most adults should exercise for at least 150 minutes each week. The exercise should increase your heart rate and make you sweat (moderate-intensity exercise).  Most adults should also do strengthening exercises at least twice a week. This is in addition to the moderate-intensity exercise.  Maintain a healthy weight  Body mass index (BMI) is a measurement that can be used to identify possible weight problems. It estimates body fat based on height and weight. Your health care provider can help determine your BMI and help you achieve or maintain a healthy weight.  For females 20 years of age and older:   A BMI below 18.5 is considered underweight.  A BMI of 18.5 to 24.9 is normal.  A BMI of 25 to 29.9 is considered overweight.  A BMI of 30 and above is considered obese.  Watch levels of cholesterol and blood lipids  You should start having your blood tested for lipids and cholesterol at 66 years of age, then have this test every 5 years.  You may need to have your cholesterol levels checked more often if:  Your lipid  or cholesterol levels are high.  You are older than 66 years of age.  You are at high risk for heart disease.  CANCER SCREENING   Lung Cancer  Lung cancer screening is recommended for adults 55-80 years old who are at high risk for lung cancer because of a history of smoking.  A yearly low-dose CT scan of the lungs is recommended for people who:  Currently smoke.  Have quit within the past 15 years.  Have at least a 30-pack-year history of smoking. A pack year is smoking an average of one pack of cigarettes a day for 1 year.  Yearly screening should continue until it has been 15 years since you quit.  Yearly screening should stop if you develop a health problem that would prevent you from having lung cancer treatment.  Breast Cancer  Practice breast self-awareness. This means understanding how your breasts normally appear and feel.  It also means doing regular breast self-exams. Let your health care provider know about any changes, no matter how small.  If you are in your 20s or 30s, you should have a clinical breast exam (CBE) by a health care provider every 1-3 years as part of a regular health exam.  If you are 40 or older, have a CBE every year. Also consider having a breast X-ray (mammogram) every year.  If you have a family history of breast cancer, talk to your health care provider about genetic screening.  If you   are at high risk for breast cancer, talk to your health care provider about having an MRI and a mammogram every year.  Breast cancer gene (BRCA) assessment is recommended for women who have family members with BRCA-related cancers. BRCA-related cancers include:  Breast.  Ovarian.  Tubal.  Peritoneal cancers.  Results of the assessment will determine the need for genetic counseling and BRCA1 and BRCA2 testing. Cervical Cancer Your health care provider may recommend that you be screened regularly for cancer of the pelvic organs (ovaries, uterus, and  vagina). This screening involves a pelvic examination, including checking for microscopic changes to the surface of your cervix (Pap test). You may be encouraged to have this screening done every 3 years, beginning at age 21.  For women ages 30-65, health care providers may recommend pelvic exams and Pap testing every 3 years, or they may recommend the Pap and pelvic exam, combined with testing for human papilloma virus (HPV), every 5 years. Some types of HPV increase your risk of cervical cancer. Testing for HPV may also be done on women of any age with unclear Pap test results.  Other health care providers may not recommend any screening for nonpregnant women who are considered low risk for pelvic cancer and who do not have symptoms. Ask your health care provider if a screening pelvic exam is right for you.  If you have had past treatment for cervical cancer or a condition that could lead to cancer, you need Pap tests and screening for cancer for at least 20 years after your treatment. If Pap tests have been discontinued, your risk factors (such as having a new sexual partner) need to be reassessed to determine if screening should resume. Some women have medical problems that increase the chance of getting cervical cancer. In these cases, your health care provider may recommend more frequent screening and Pap tests. Colorectal Cancer  This type of cancer can be detected and often prevented.  Routine colorectal cancer screening usually begins at 66 years of age and continues through 66 years of age.  Your health care provider may recommend screening at an earlier age if you have risk factors for colon cancer.  Your health care provider may also recommend using home test kits to check for hidden blood in the stool.  A small camera at the end of a tube can be used to examine your colon directly (sigmoidoscopy or colonoscopy). This is done to check for the earliest forms of colorectal  cancer.  Routine screening usually begins at age 50.  Direct examination of the colon should be repeated every 5-10 years through 66 years of age. However, you may need to be screened more often if early forms of precancerous polyps or small growths are found. Skin Cancer  Check your skin from head to toe regularly.  Tell your health care provider about any new moles or changes in moles, especially if there is a change in a mole's shape or color.  Also tell your health care provider if you have a mole that is larger than the size of a pencil eraser.  Always use sunscreen. Apply sunscreen liberally and repeatedly throughout the day.  Protect yourself by wearing long sleeves, pants, a wide-brimmed hat, and sunglasses whenever you are outside. HEART DISEASE, DIABETES, AND HIGH BLOOD PRESSURE   High blood pressure causes heart disease and increases the risk of stroke. High blood pressure is more likely to develop in:  People who have blood pressure in the high end   of the normal range (130-139/85-89 mm Hg).  People who are overweight or obese.  People who are African American.  If you are 38-23 years of age, have your blood pressure checked every 3-5 years. If you are 61 years of age or older, have your blood pressure checked every year. You should have your blood pressure measured twice--once when you are at a hospital or clinic, and once when you are not at a hospital or clinic. Record the average of the two measurements. To check your blood pressure when you are not at a hospital or clinic, you can use:  An automated blood pressure machine at a pharmacy.  A home blood pressure monitor.  If you are between 45 years and 39 years old, ask your health care provider if you should take aspirin to prevent strokes.  Have regular diabetes screenings. This involves taking a blood sample to check your fasting blood sugar level.  If you are at a normal weight and have a low risk for diabetes,  have this test once every three years after 66 years of age.  If you are overweight and have a high risk for diabetes, consider being tested at a younger age or more often. PREVENTING INFECTION  Hepatitis B  If you have a higher risk for hepatitis B, you should be screened for this virus. You are considered at high risk for hepatitis B if:  You were born in a country where hepatitis B is common. Ask your health care provider which countries are considered high risk.  Your parents were born in a high-risk country, and you have not been immunized against hepatitis B (hepatitis B vaccine).  You have HIV or AIDS.  You use needles to inject street drugs.  You live with someone who has hepatitis B.  You have had sex with someone who has hepatitis B.  You get hemodialysis treatment.  You take certain medicines for conditions, including cancer, organ transplantation, and autoimmune conditions. Hepatitis C  Blood testing is recommended for:  Everyone born from [birth year] through 1966.  Anyone with known risk factors for hepatitis C. Sexually transmitted infections (STIs)  You should be screened for sexually transmitted infections (STIs) including gonorrhea and chlamydia if:  You are sexually active and are younger than 66 years of age.  You are older than 66 years of age and your health care provider tells you that you are at risk for this type of infection.  Your sexual activity has changed since you were last screened and you are at an increased risk for chlamydia or gonorrhea. Ask your health care provider if you are at risk.  If you do not have HIV, but are at risk, it may be recommended that you take a prescription medicine daily to prevent HIV infection. This is called pre-exposure prophylaxis (PrEP). You are considered at risk if:  You are sexually active and do not regularly use condoms or know the HIV status of your partner(s).  You take drugs by injection.  You are sexually  active with a partner who has HIV. Talk with your health care provider about whether you are at high risk of being infected with HIV. If you choose to begin PrEP, you should first be tested for HIV. You should then be tested every 3 months for as long as you are taking PrEP.  PREGNANCY   If you are premenopausal and you may become pregnant, ask your health care provider about preconception counseling.  If you may  become pregnant, take 400 to 800 micrograms (mcg) of folic acid every day.  If you want to prevent pregnancy, talk to your health care provider about birth control (contraception). OSTEOPOROSIS AND MENOPAUSE   Osteoporosis is a disease in which the bones lose minerals and strength with aging. This can result in serious bone fractures. Your risk for osteoporosis can be identified using a bone density scan.  If you are 61 years of age or older, or if you are at risk for osteoporosis and fractures, ask your health care provider if you should be screened.  Ask your health care provider whether you should take a calcium or vitamin D supplement to lower your risk for osteoporosis.  Menopause may have certain physical symptoms and risks.  Hormone replacement therapy may reduce some of these symptoms and risks. Talk to your health care provider about whether hormone replacement therapy is right for you.  HOME CARE INSTRUCTIONS   Schedule regular health, dental, and eye exams.  Stay current with your immunizations.   Do not use any tobacco products including cigarettes, chewing tobacco, or electronic cigarettes.  If you are pregnant, do not drink alcohol.  If you are breastfeeding, limit how much and how often you drink alcohol.  Limit alcohol intake to no more than 1 drink per day for nonpregnant women. One drink equals 12 ounces of beer, 5 ounces of wine, or 1 ounces of hard liquor.  Do not use street drugs.  Do not share needles.  Ask your health care provider for help if  you need support or information about quitting drugs.  Tell your health care provider if you often feel depressed.  Tell your health care provider if you have ever been abused or do not feel safe at home.   This information is not intended to replace advice given to you by your health care provider. Make sure you discuss any questions you have with your health care provider.   Document Released: 06/23/2011 Document Revised: 12/29/2014 Document Reviewed: 11/09/2013 Elsevier Interactive Patient Education [employer].

## 2016-05-16 NOTE — Progress Notes (Addendum)
Subjective:    Patient ID: Jean [name], female    DOB: [birth year], 65 y.o.   MRN: [id number]   Patient here today for follow up of chronic medical problems.  Outpatient Encounter Prescriptions as of 05/16/2016  Medication Sig  . aspirin 81 MG tablet Take 81 mg by mouth daily.    Marland Kitchen ezetimibe (ZETIA) 10 MG tablet TAKE 1 TABLET EVERY DAY  . Fexofenadine HCl (ALLEGRA PO) Take by mouth.  . metoprolol tartrate (LOPRESSOR) 25 MG tablet Take 0.5 tablets (12.5 mg total) by mouth 2 (two) times daily.  . sertraline (ZOLOFT) 50 MG tablet TAKE 1 TABLET (50 MG TOTAL) BY MOUTH DAILY.  . simvastatin (ZOCOR) 20 MG tablet TAKE 1 TABLET (20 MG TOTAL) BY MOUTH DAILY AT 6 PM.  . valsartan-hydrochlorothiazide (DIOVAN-HCT) 160-12.5 MG tablet TAKE 1 TABLET BY MOUTH DAILY.          Hypertension This is a chronic problem. The current episode started more than 1 year ago. The problem is controlled. Pertinent negatives include no chest pain, headaches, neck pain, palpitations or shortness of breath. Risk factors for coronary artery disease include dyslipidemia and post-menopausal state. Past treatments include angiotensin blockers and diuretics. The current treatment provides moderate improvement. There are no compliance problems.  Hypertensive end-organ damage includes CAD/MI. There is no history of CVA, heart failure or PVD.  Hyperlipidemia This is a chronic problem. The current episode started more than 1 year ago. Recent lipid tests were reviewed and are variable. Pertinent negatives include no chest pain or shortness of breath. Current antihyperlipidemic treatment includes statins. The current treatment provides moderate improvement of lipids. There are no compliance problems.  Risk factors for coronary artery disease include dyslipidemia, hypertension and post-menopausal.  Depression Currently on zoloft- doing well- no side effects Hx SVT No recent episodes-currently on metoprolol     Review of  Systems  Constitutional: Negative.   HENT: Negative.   Respiratory: Negative for shortness of breath.   Cardiovascular: Negative for chest pain and palpitations.  Genitourinary: Negative.   Musculoskeletal: Negative for neck pain.  Neurological: Negative for headaches.  Psychiatric/Behavioral: Negative.   All other systems reviewed and are negative.      Objective:   Physical Exam  Constitutional: She is oriented to person, place, and time. She appears well-developed and well-nourished.  HENT:  Nose: Nose normal.  Mouth/Throat: Oropharynx is clear and moist.  Eyes: EOM are normal.  Neck: Trachea normal, normal range of motion and full passive range of motion without pain. Neck supple. No JVD present. Carotid bruit is not present. No thyromegaly present.  Cardiovascular: Normal rate, regular rhythm and intact distal pulses.  Exam reveals gallop and S3. Exam reveals no friction rub.   No murmur heard. Pulmonary/Chest: Effort normal and breath sounds normal.  Abdominal: Soft. Bowel sounds are normal. She exhibits no distension and no mass. There is no tenderness.  Musculoskeletal: Normal range of motion.  Lymphadenopathy:    She has no cervical adenopathy.  Neurological: She is alert and oriented to person, place, and time. She has normal reflexes.  Skin: Skin is warm and dry.  Psychiatric: She has a normal mood and affect. Her behavior is normal. Judgment and thought content normal.    BP 127/74 mmHg  Pulse 49  Temp(Src) 98 F (36.7 C) (Oral)  Ht _0  (1.676 m)  Wt 158 lb (71.668 kg)  BMI 25.51 kg/m2       Assessment & Plan:  1. SVT (supraventricular tachycardia) (  HCC) - metoprolol tartrate (LOPRESSOR) 25 MG tablet; Take 0.5 tablets (12.5 mg total) by mouth 2 (two) times daily.  Dispense: 90 tablet; Refill: 3  2. Essential hypertension Do not add slat to diet - CMP14+EGFR - valsartan-hydrochlorothiazide (DIOVAN-HCT) 160-12.5 MG tablet; Take 1 tablet by mouth daily.   Dispense: 90 tablet; Refill: 1  3. PVC's (premature ventricular contractions)  4. Hyperlipidemia Low fat diet - Lipid panel - simvastatin (ZOCOR) 20 MG tablet; TAKE 1 TABLET (20 MG TOTAL) BY MOUTH DAILY AT 6 PM.  Dispense: 90 tablet; Refill: 1 - ezetimibe (ZETIA) 10 MG tablet; Take 1 tablet (10 mg total) by mouth daily.  Dispense: 90 tablet; Refill: 1  5. Depression Stress management - sertraline (ZOLOFT) 50 MG tablet; TAKE 1 TABLET (50 MG TOTAL) BY MOUTH DAILY.  Dispense: 90 tablet; Refill: 1  6. BMI 26.0-26.9,adult Discussed diet and exercise for person with BMI >25 Will recheck weight in 3-6 months  7. Screening for malignant neoplasm of the rectum - Fecal occult blood, imunochemical; Future  8. Need for hepatitis C screening test - Hepatitis C antibody   Patient to schedule mammogram Labs pending Health maintenance reviewed Diet and exercise encouraged Continue all meds Follow up  In 6 month   [place], FNP

## 2016-05-17 LAB — LIPID PANEL
Chol/HDL Ratio: 3.6 ratio units (ref 0.0–4.4)
Cholesterol, Total: 187 mg/dL (ref 100–199)
HDL: 52 mg/dL (ref >39)
LDL CALC: 114 mg/dL — AB (ref 0–99)
TRIGLYCERIDES: 103 mg/dL (ref 0–149)
VLDL CHOLESTEROL CAL: 21 mg/dL (ref 5–40)

## 2016-05-17 LAB — CMP14+EGFR
ALK PHOS: 90 IU/L (ref 39–117)
ALT: 14 IU/L (ref 0–32)
AST: 22 IU/L (ref 0–40)
Albumin/Globulin Ratio: 1.4 (ref 1.2–2.2)
Albumin: 4.2 g/dL (ref 3.6–4.8)
BUN/Creatinine Ratio: 28 (ref 12–28)
BUN: 20 mg/dL (ref 8–27)
Bilirubin Total: 0.6 mg/dL (ref 0.0–1.2)
CHLORIDE: 99 mmol/L (ref 96–106)
CO2: 25 mmol/L (ref 18–29)
CREATININE: 0.72 mg/dL (ref 0.57–1.00)
Calcium: 9.7 mg/dL (ref 8.7–10.3)
GFR calc Af Amer: 101 mL/min/{1.73_m2} (ref >59)
GFR calc non Af Amer: 88 mL/min/{1.73_m2} (ref >59)
GLOBULIN, TOTAL: 3 g/dL (ref 1.5–4.5)
GLUCOSE: 88 mg/dL (ref 65–99)
Potassium: 4.3 mmol/L (ref 3.5–5.2)
SODIUM: 142 mmol/L (ref 134–144)
Total Protein: 7.2 g/dL (ref 6.0–8.5)

## 2016-05-17 LAB — HEPATITIS C ANTIBODY

## 2016-07-11 ENCOUNTER — Telehealth: Payer: Self-pay | Admitting: Nurse Practitioner

## 2016-07-11 NOTE — Telephone Encounter (Signed)
Pt will call to get Mammogram scheduled

## 2016-11-06 ENCOUNTER — Other Ambulatory Visit: Payer: Self-pay | Admitting: Nurse Practitioner

## 2017-02-03 ENCOUNTER — Other Ambulatory Visit: Payer: Self-pay | Admitting: Nurse Practitioner

## 2017-02-11 ENCOUNTER — Telehealth: Payer: Self-pay | Admitting: *Deleted

## 2017-02-11 NOTE — Telephone Encounter (Signed)
-----   Message from Chevis Pretty, [place] sent at 02/07/2017 11:17 AM EST ----- Remind patient to please do hemoccult cards given at appointment

## 2017-02-11 NOTE — Telephone Encounter (Signed)
Patient reminded to complete stool cards and return to lab.

## 2017-03-11 ENCOUNTER — Encounter: Payer: Self-pay | Admitting: *Deleted

## 2017-03-12 ENCOUNTER — Other Ambulatory Visit: Payer: Self-pay | Admitting: Nurse Practitioner

## 2017-03-12 DIAGNOSIS — I10 Essential (primary) hypertension: Secondary | ICD-10-CM

## 2017-03-22 NOTE — Progress Notes (Signed)
Jean [name] Date of Birth: December 07, 1951   History of Present Illness: [name] is seen today for yearly followup. She has a history of SVT in the past and PVCs. Echo in 2012 showed normal LV function. Event monitor then showed predominantly PVCs with only a 4 beat run of PAT. She reports she has done very well over the past year. She states she does have some irregular heart beat about 2-3 times per month. This may last up to 8 hours. No clear triggers. She doesn't think there has been any change in pattern over the past year. It doesn't really bother her. No dizziness, syncope, chest pain, or SOB. Feels well overall.   Current Outpatient Prescriptions on File Prior to Visit  Medication Sig Dispense Refill  . aspirin 81 MG tablet Take 81 mg by mouth daily.      Marland Kitchen ezetimibe (ZETIA) 10 MG tablet Take 1 tablet (10 mg total) by mouth daily. 90 tablet 1  . Fexofenadine HCl (ALLEGRA PO) Take by mouth.    . metoprolol tartrate (LOPRESSOR) 25 MG tablet Take 0.5 tablets (12.5 mg total) by mouth 2 (two) times daily. 90 tablet 3  . sertraline (ZOLOFT) 50 MG tablet TAKE 1 TABLET (50 MG TOTAL) BY MOUTH DAILY. 90 tablet 0  . simvastatin (ZOCOR) 20 MG tablet TAKE 1 TABLET (20 MG TOTAL) BY MOUTH DAILY AT 6 PM. 90 tablet 1  . valsartan-hydrochlorothiazide (DIOVAN-HCT) 160-12.5 MG tablet TAKE 1 TABLET BY MOUTH DAILY. 90 tablet 0   No current facility-administered medications on file prior to visit.     No Known Allergies  Past Medical History:  Diagnosis Date  . Gestational diabetes    WITH HER CHILDBIRTH  . Hyperlipidemia   . Hypertension   . Mitral insufficiency    TRIVIAL  . Palpitations   . PVC's (premature ventricular contractions) 03/16/2014  . SVT (supraventricular tachycardia) (HCC)     Past Surgical History:  Procedure Laterality Date  . US ECHOCARDIOGRAPHY     EF 60%    History  Smoking Status  . Never Smoker  Smokeless Tobacco  . Never Used    History  Alcohol Use No     Family History  Problem Relation Age of Onset  . Heart disease Mother   . Hypertension Sister   . Diabetes Sister   . Anemia Paternal Grandmother     Review of Systems: As noted in HPI. All other systems were reviewed and are negative.  Physical Exam: BP 140/66   Pulse (!) 53   Ht 5\' 6"  (1.676 m)   Wt 166 lb 9.6 oz (75.6 kg)   BMI 26.89 kg/m  She is a pleasant white female in no acute distress. HEENT exam is unremarkable. Neck is supple without JVD, adenopathy, thyromegaly, or bruits. Lungs are clear. Cardiac exam reveals a regular rate and rhythm. S1 and  S2 are normal. Positive click. There is no murmur or gallop. Abdomen is soft and nontender without masses or hepatosplenomegaly. Femoral and pedal pulses are 2+ and symmetric. She has no edema. Skin is warm and dry. She is alert and oriented x3. Cranial nerves II through XII are intact.  LABORATORY DATA: ECG today demonstrates sinus bradycardia with a rate of 53. beats per minute. It is otherwise normal. I have personally reviewed and interpreted this study.   Lab Results  Component Value Date   WBC 6.1 07/08/2011   HGB 15.1 (H) 07/08/2011   HCT 44.3 07/08/2011   PLT  174.0 07/08/2011   GLUCOSE 88 05/16/2016   CHOL 187 05/16/2016   TRIG 103 05/16/2016   HDL 52 05/16/2016   LDLCALC 114 (H) 05/16/2016   ALT 14 05/16/2016   AST 22 05/16/2016   NA 142 05/16/2016   K 4.3 05/16/2016   CL 99 05/16/2016   CREATININE 0.72 05/16/2016   BUN 20 05/16/2016   CO2 25 05/16/2016   TSH 1.00 07/08/2011     Assessment / Plan: 1. Supraventricular tachycardia/ PVCs. Well controlled on very low dose of metoprolol 12.5 mg bid. Unable to tolerate a higher dose due to bradycardia.  No change in symptoms. We will continue her current therapy. I'll followup again in one year.  2. Hypertension, well controlled   3. Hyperlipidemia. On Zetia and Zocor. Recent lipids look Ok

## 2017-03-23 ENCOUNTER — Ambulatory Visit (INDEPENDENT_AMBULATORY_CARE_PROVIDER_SITE_OTHER): Payer: Medicare Other | Admitting: Cardiology

## 2017-03-23 ENCOUNTER — Encounter: Payer: Self-pay | Admitting: Cardiology

## 2017-03-23 VITALS — BP 140/66 | HR 53 | Ht 66.0 in | Wt 166.6 lb

## 2017-03-23 DIAGNOSIS — E7800 Pure hypercholesterolemia, unspecified: Secondary | ICD-10-CM

## 2017-03-23 DIAGNOSIS — I493 Ventricular premature depolarization: Secondary | ICD-10-CM | POA: Diagnosis not present

## 2017-03-23 DIAGNOSIS — I471 Supraventricular tachycardia: Secondary | ICD-10-CM

## 2017-03-23 NOTE — Patient Instructions (Signed)
Continue your current therapy  I will see you in one year   

## 2017-05-01 ENCOUNTER — Other Ambulatory Visit: Payer: Self-pay | Admitting: Cardiology

## 2017-05-01 DIAGNOSIS — I471 Supraventricular tachycardia: Secondary | ICD-10-CM

## 2017-05-05 ENCOUNTER — Other Ambulatory Visit: Payer: Self-pay | Admitting: Pediatrics

## 2017-05-19 ENCOUNTER — Other Ambulatory Visit: Payer: Self-pay | Admitting: Pediatrics

## 2017-05-21 ENCOUNTER — Ambulatory Visit (INDEPENDENT_AMBULATORY_CARE_PROVIDER_SITE_OTHER): Payer: Medicare Other | Admitting: Nurse Practitioner

## 2017-05-21 ENCOUNTER — Encounter: Payer: Self-pay | Admitting: Nurse Practitioner

## 2017-05-21 VITALS — BP 128/70 | HR 47 | Temp 97.5°F | Ht 66.0 in | Wt 167.0 lb

## 2017-05-21 DIAGNOSIS — I471 Supraventricular tachycardia: Secondary | ICD-10-CM | POA: Diagnosis not present

## 2017-05-21 DIAGNOSIS — E7800 Pure hypercholesterolemia, unspecified: Secondary | ICD-10-CM | POA: Diagnosis not present

## 2017-05-21 DIAGNOSIS — I493 Ventricular premature depolarization: Secondary | ICD-10-CM | POA: Diagnosis not present

## 2017-05-21 DIAGNOSIS — I10 Essential (primary) hypertension: Secondary | ICD-10-CM | POA: Diagnosis not present

## 2017-05-21 DIAGNOSIS — F3342 Major depressive disorder, recurrent, in full remission: Secondary | ICD-10-CM

## 2017-05-21 DIAGNOSIS — Z6826 Body mass index (BMI) 26.0-26.9, adult: Secondary | ICD-10-CM | POA: Diagnosis not present

## 2017-05-21 MED ORDER — VALSARTAN-HYDROCHLOROTHIAZIDE 160-12.5 MG PO TABS: 1.0000 | ORAL_TABLET | Freq: Every day | ORAL | 0 refills | Status: DC

## 2017-05-21 MED ORDER — METOPROLOL TARTRATE 25 MG PO TABS: 12.5000 mg | ORAL_TABLET | Freq: Two times a day (BID) | ORAL | 1 refills | Status: DC

## 2017-05-21 MED ORDER — SERTRALINE HCL 50 MG PO TABS: ORAL_TABLET | ORAL | 1 refills | Status: DC

## 2017-05-21 MED ORDER — EZETIMIBE 10 MG PO TABS: 10.0000 mg | ORAL_TABLET | Freq: Every day | ORAL | 1 refills | Status: DC

## 2017-05-21 MED ORDER — SIMVASTATIN 20 MG PO TABS: ORAL_TABLET | ORAL | 1 refills | Status: DC

## 2017-05-21 NOTE — Patient Instructions (Signed)
How to Take Your Blood Pressure You can take your blood pressure at home with a machine. You may need to check your blood pressure at home:  To check if you have high blood pressure (hypertension).  To check your blood pressure over time.  To make sure your blood pressure medicine is working.  Supplies needed: You will need a blood pressure machine, or monitor. You can buy one at a drugstore or online. When choosing one:  Choose one with an arm cuff.  Choose one that wraps around your upper arm. Only one finger should fit between your arm and the cuff.  Do not choose one that measures your blood pressure from your wrist or finger.  Your doctor can suggest a monitor. How to prepare Avoid these things for 30 minutes before checking your blood pressure:  Drinking caffeine.  Drinking alcohol.  Eating.  Smoking.  Exercising.  Five minutes before checking your blood pressure:  Pee.  Sit in a dining chair. Avoid sitting in a soft couch or armchair.  Be quiet. Do not talk.  How to take your blood pressure Follow the instructions that came with your machine. If you have a digital blood pressure monitor, these may be the instructions: 1. Sit up straight. 2. Place your feet on the floor. Do not cross your ankles or legs. 3. Rest your left arm at the level of your heart. You may rest it on a table, desk, or chair. 4. Pull up your shirt sleeve. 5. Wrap the blood pressure cuff around the upper part of your left arm. The cuff should be 1 inch (2.5 cm) above your elbow. It is best to wrap the cuff around bare skin. 6. Fit the cuff snugly around your arm. You should be able to place only one finger between the cuff and your arm. 7. Put the cord inside the groove of your elbow. 8. Press the power button. 9. Sit quietly while the cuff fills with air and loses air. 10. Write down the numbers on the screen. 11. Wait 2-3 minutes and then repeat steps 1-10.  What do the numbers  mean? Two numbers make up your blood pressure. The first number is called systolic pressure. The second is called diastolic pressure. An example of a blood pressure reading is "120 over 80" (or 120/80). If you are an adult and do not have a medical condition, use this guide to find out if your blood pressure is normal: Normal  First number: below 120.  Second number: below 80. Elevated  First number: 120-129.  Second number: below 80. Hypertension stage 1  First number: 130-139.  Second number: 80-89. Hypertension stage 2  First number: 140 or above.  Second number: 90 or above. Your blood pressure is above normal even if only the top or bottom number is above normal. Follow these instructions at home:  Check your blood pressure as often as your doctor tells you to.  Take your monitor to your next doctor's appointment. Your doctor will: ? Make sure you are using it correctly. ? Make sure it is working right.  Make sure you understand what your blood pressure numbers should be.  Tell your doctor if your medicines are causing side effects. Contact a doctor if:  Your blood pressure keeps being high. Get help right away if:  Your first blood pressure number is higher than 180.  Your second blood pressure number is higher than 120. This information is not intended to replace advice given   to you by your health care provider. Make sure you discuss any questions you have with your health care provider. Document Released: 11/20/2008 Document Revised: 11/05/2016 Document Reviewed: 05/16/2016 Elsevier Interactive Patient Education  2018 [employer] Inc[employer]  

## 2017-05-21 NOTE — Progress Notes (Signed)
Subjective:    Patient ID: Jean [name], female    DOB: [birth year], 66 y.o.   MRN: [id number]  HPI  Jean [name] is here today for follow up of chronic medical problem.  Outpatient Encounter Prescriptions as of 05/21/2017  Medication Sig  . aspirin 81 MG tablet Take 81 mg by mouth daily.    Marland Kitchen ezetimibe (ZETIA) 10 MG tablet Take 1 tablet (10 mg total) by mouth daily.  Marland Kitchen Fexofenadine HCl (ALLEGRA PO) Take by mouth.  . metoprolol tartrate (LOPRESSOR) 25 MG tablet Take 0.5 tablets (12.5 mg total) by mouth 2 (two) times daily.  . sertraline (ZOLOFT) 50 MG tablet TAKE 1 TABLET (50 MG TOTAL) BY MOUTH DAILY.  . simvastatin (ZOCOR) 20 MG tablet TAKE 1 TABLET (20 MG TOTAL) BY MOUTH DAILY AT 6 PM.  . valsartan-hydrochlorothiazide (DIOVAN-HCT) 160-12.5 MG tablet Take 1 tablet by mouth daily.     1. SVT (supraventricular tachycardia) ([place])  No recent epidsodes-she takes metoprolol daily- sees cardiologist every 6 months  2. PVC's (premature ventricular contractions)  Cardiology watches  3. Essential hypertension  No c/o chest pain,SOB or HA- does not check blood pressure athome  4. Pure hypercholesterolemia  Tries to watch fatin diet  5. Recurrent major depressive disorder, in full remission ([place])  Is currently on zoloft- doing well without medication side effects  6. BMI 26.0-26.9,adult  No recent weight gain or weight loss    New complaints: None today     Review of Systems  Constitutional: Negative for diaphoresis.  Eyes: Negative for pain.  Respiratory: Negative for shortness of breath.   Cardiovascular: Negative for chest pain, palpitations and leg swelling.  Gastrointestinal: Negative for abdominal pain.  Endocrine: Negative for polydipsia.  Skin: Negative for rash.  Neurological: Negative for dizziness, weakness and headaches.  Hematological: Does not bruise/bleed easily.       Objective:   Physical Exam  Constitutional: She is oriented to person, place, and  time. She appears well-developed and well-nourished.  HENT:  Nose: Nose normal.  Mouth/Throat: Oropharynx is clear and moist.  Eyes: EOM are normal.  Neck: Trachea normal, normal range of motion and full passive range of motion without pain. Neck supple. No JVD present. Carotid bruit is not present. No thyromegaly present.  Cardiovascular: Normal rate, regular rhythm, normal heart sounds and intact distal pulses.  Exam reveals no gallop and no friction rub.   No murmur heard. Pulmonary/Chest: Effort normal and breath sounds normal.  Abdominal: Soft. Bowel sounds are normal. She exhibits no distension and no mass. There is no tenderness.  Musculoskeletal: Normal range of motion.  Lymphadenopathy:    She has no cervical adenopathy.  Neurological: She is alert and oriented to person, place, and time. She has normal reflexes.  Skin: Skin is warm and dry.  Psychiatric: She has a normal mood and affect. Her behavior is normal. Judgment and thought content normal.   BP 128/70   Pulse (!) 47   Temp 97.5 F (36.4 C) (Oral)   Ht '5\' 6"'$  (1.676 m)   Wt 167 lb (75.8 kg)   BMI 26.95 kg/m        Assessment & Plan:  1. SVT (supraventricular tachycardia) (HCC) Continue current medication regimen. - metoprolol tartrate (LOPRESSOR) 25 MG tablet; Take 0.5 tablets (12.5 mg total) by mouth 2 (two) times daily.  Dispense: 90 tablet; Refill: 1  2. PVC's (premature ventricular contractions)   3. Essential hypertension Low salt diet.  Begin checking blood pressure  at home at least weekly. - valsartan-hydrochlorothiazide (DIOVAN-HCT) 160-12.5 MG tablet; Take 1 tablet by mouth daily.  Dispense: 90 tablet; Refill: 0 - CMP14+EGFR  4. Pure hypercholesterolemia Low fat diet.  Continue current medications. - ezetimibe (ZETIA) 10 MG tablet; Take 1 tablet (10 mg total) by mouth daily.  Dispense: 90 tablet; Refill: 1 - simvastatin (ZOCOR) 20 MG tablet; TAKE 1 TABLET (20 MG TOTAL) BY MOUTH DAILY AT 6 PM.   Dispense: 90 tablet; Refill: 1 - Lipid panel  5. Recurrent major depressive disorder, in full remission ([place]) Continue current medication and practice stress management. - sertraline (ZOLOFT) 50 MG tablet; TAKE 1 TABLET (50 MG TOTAL) BY MOUTH DAILY.  Dispense: 90 tablet; Refill: 1  6. BMI 26.0-26.9,adult Discussed diet and exercise for person with BMI >25 Will recheck weight in 3-6 months     Labs pending Health maintenance reviewed Diet and exercise encouraged Continue all meds Follow up  In 3 months.   Mary-Margaret Hassell Done, FNP Marissa Hite, SNP

## 2017-05-22 LAB — CMP14+EGFR
ALT: 24 IU/L (ref 0–32)
AST: 27 IU/L (ref 0–40)
Albumin/Globulin Ratio: 1.5 (ref 1.2–2.2)
Albumin: 4.4 g/dL (ref 3.6–4.8)
Alkaline Phosphatase: 101 IU/L (ref 39–117)
BUN/Creatinine Ratio: 23 (ref 12–28)
BUN: 17 mg/dL (ref 8–27)
Bilirubin Total: 0.5 mg/dL (ref 0.0–1.2)
CALCIUM: 9.6 mg/dL (ref 8.7–10.3)
CO2: 26 mmol/L (ref 18–29)
CREATININE: 0.73 mg/dL (ref 0.57–1.00)
Chloride: 99 mmol/L (ref 96–106)
GFR calc non Af Amer: 85 mL/min/{1.73_m2} (ref >59)
GFR, EST AFRICAN AMERICAN: 99 mL/min/{1.73_m2} (ref >59)
GLUCOSE: 109 mg/dL — AB (ref 65–99)
Globulin, Total: 2.9 g/dL (ref 1.5–4.5)
Potassium: 4.3 mmol/L (ref 3.5–5.2)
Sodium: 141 mmol/L (ref 134–144)
TOTAL PROTEIN: 7.3 g/dL (ref 6.0–8.5)

## 2017-05-22 LAB — LIPID PANEL
CHOL/HDL RATIO: 4.4 ratio (ref 0.0–4.4)
Cholesterol, Total: 230 mg/dL — ABNORMAL HIGH (ref 100–199)
HDL: 52 mg/dL (ref >39)
LDL Calculated: 150 mg/dL — ABNORMAL HIGH (ref 0–99)
TRIGLYCERIDES: 142 mg/dL (ref 0–149)
VLDL CHOLESTEROL CAL: 28 mg/dL (ref 5–40)

## 2017-08-04 ENCOUNTER — Telehealth: Payer: Self-pay | Admitting: Nurse Practitioner

## 2017-08-04 NOTE — Telephone Encounter (Signed)
Patient states she will come by this office to pick up FOBT

## 2017-09-07 ENCOUNTER — Other Ambulatory Visit: Payer: Self-pay | Admitting: Nurse Practitioner

## 2017-09-07 DIAGNOSIS — I10 Essential (primary) hypertension: Secondary | ICD-10-CM

## 2017-12-08 ENCOUNTER — Other Ambulatory Visit: Payer: Self-pay | Admitting: Nurse Practitioner

## 2017-12-08 DIAGNOSIS — I10 Essential (primary) hypertension: Secondary | ICD-10-CM

## 2017-12-08 NOTE — Telephone Encounter (Signed)
Last seen 05/21/17  MMM

## 2018-02-21 ENCOUNTER — Other Ambulatory Visit: Payer: Self-pay | Admitting: Nurse Practitioner

## 2018-02-21 DIAGNOSIS — F3342 Major depressive disorder, recurrent, in full remission: Secondary | ICD-10-CM

## 2018-03-05 ENCOUNTER — Other Ambulatory Visit: Payer: Self-pay | Admitting: *Deleted

## 2018-03-05 DIAGNOSIS — I10 Essential (primary) hypertension: Secondary | ICD-10-CM

## 2018-03-05 MED ORDER — VALSARTAN-HYDROCHLOROTHIAZIDE 160-12.5 MG PO TABS: 1.0000 | ORAL_TABLET | Freq: Every day | ORAL | 0 refills | Status: DC

## 2018-04-01 ENCOUNTER — Encounter: Payer: Self-pay | Admitting: Cardiology

## 2018-04-06 NOTE — Progress Notes (Signed)
Jean [name] Date of Birth: [birth year]   History of Present Illness: Jean [name] is seen today for yearly followup. She has a history of SVT in the past and PVCs. Echo in 2012 showed normal LV function. Event monitor then showed predominantly PVCs with only a 4 beat run of PAT.   She reports she has done very well over the past year. She states she does have some racing in her heart about 2-3 times per month. This may last up to several hours. No clear triggers. She doesn't think there has been any change in pattern over the past year.  No dizziness, syncope, chest pain, or SOB. Feels well overall.   Current Outpatient Medications on File Prior to Visit  Medication Sig Dispense Refill  . aspirin 81 MG tablet Take 81 mg by mouth daily.      Marland Kitchen ezetimibe (ZETIA) 10 MG tablet Take 1 tablet (10 mg total) by mouth daily. 90 tablet 1  . Fexofenadine HCl (ALLEGRA PO) Take by mouth.    . metoprolol tartrate (LOPRESSOR) 25 MG tablet Take 0.5 tablets (12.5 mg total) by mouth 2 (two) times daily. 90 tablet 1  . sertraline (ZOLOFT) 50 MG tablet TAKE 1 TABLET BY MOUTH EVERY DAY 90 tablet 0  . simvastatin (ZOCOR) 20 MG tablet TAKE 1 TABLET (20 MG TOTAL) BY MOUTH DAILY AT 6 PM. 90 tablet 1  . valsartan-hydrochlorothiazide (DIOVAN-HCT) 160-12.5 MG tablet Take 1 tablet by mouth daily. 90 tablet 0   No current facility-administered medications on file prior to visit.     No Known Allergies  Past Medical History:  Diagnosis Date  . Gestational diabetes    WITH HER CHILDBIRTH  . Hyperlipidemia   . Hypertension   . Mitral insufficiency    [facility]IAL  . Palpitations   . PVC's (premature ventricular contractions) 03/16/2014  . SVT (supraventricular tachycardia) (HCC)     Past Surgical History:  Procedure Laterality Date  . US ECHOCARDIOGRAPHY     EF 60%    Social History   Tobacco Use  Smoking Status Never Smoker  Smokeless Tobacco Never Used    Social History   Substance and Sexual  Activity  Alcohol Use No    Family History  Problem Relation Age of Onset  . Heart disease Mother   . Hypertension Sister   . Diabetes Sister   . Anemia Paternal Grandmother     Review of Systems: As noted in HPI. All other systems were reviewed and are negative.  Physical Exam: BP 115/82   Pulse (!) 53   Ht 5\' 6"  (1.676 m)   Wt 166 lb 12.8 oz (75.7 kg)   BMI 26.92 kg/m  GENERAL:  Well appearing Jean [name] in NAD HEENT:  PERRL, EOMI, sclera are clear. Oropharynx is clear. NECK:  No jugular venous distention, carotid upstroke brisk and symmetric, no bruits, no thyromegaly or adenopathy LUNGS:  Clear to auscultation bilaterally CHEST:  Unremarkable HEART:  RRR,  PMI not displaced or sustained,S1 and S2 within normal limits, no S3, no S4: no clicks, no rubs, no murmurs ABD:  Soft, nontender. BS +, no masses or bruits. No hepatomegaly, no splenomegaly EXT:  2 + pulses throughout, no edema, no cyanosis no clubbing SKIN:  Warm and dry.  No rashes NEURO:  Alert and oriented x 3. Cranial nerves II through XII intact. PSYCH:  Cognitively intact    LABORATORY DATA: ECG today demonstrates sinus bradycardia with a rate of 53. beats per minute. It  is otherwise normal. I have personally reviewed and interpreted this study.   Lab Results  Component Value Date   WBC 6.1 07/08/2011   HGB 15.1 (H) 07/08/2011   HCT 44.3 07/08/2011   PLT 174.0 07/08/2011   GLUCOSE 109 (H) 05/21/2017   CHOL 230 (H) 05/21/2017   TRIG 142 05/21/2017   HDL 52 05/21/2017   LDLCALC 150 (H) 05/21/2017   ALT 24 05/21/2017   AST 27 05/21/2017   NA 141 05/21/2017   K 4.3 05/21/2017   CL 99 05/21/2017   CREATININE 0.73 05/21/2017   BUN 17 05/21/2017   CO2 26 05/21/2017   TSH 1.00 07/08/2011     Assessment / Plan: 1. Supraventricular tachycardia/ PVCs. Under fair control  on very low dose of metoprolol 12.5 mg bid. Unable to tolerate a higher dose due to bradycardia.  Instructed her on Valsalva maneuver. May  take an extra 1/2 of metoprolol prn. Follow up in  One year.  2. Hypertension, well controlled   3. Hyperlipidemia. On Zetia and Zocor. Labs followed by primary care.

## 2018-04-07 ENCOUNTER — Ambulatory Visit: Payer: Medicare Other | Admitting: Cardiology

## 2018-04-07 ENCOUNTER — Encounter: Payer: Self-pay | Admitting: Cardiology

## 2018-04-07 VITALS — BP 115/82 | HR 53 | Ht 66.0 in | Wt 166.8 lb

## 2018-04-07 DIAGNOSIS — I493 Ventricular premature depolarization: Secondary | ICD-10-CM

## 2018-04-07 DIAGNOSIS — I471 Supraventricular tachycardia: Secondary | ICD-10-CM

## 2018-04-07 NOTE — Patient Instructions (Signed)
Continue your current therapy  If your racing continues you can take an extra 1/2 of metoprolol or try a Valsalva maneuver.  I will see you in one year.

## 2018-05-16 ENCOUNTER — Other Ambulatory Visit: Payer: Self-pay | Admitting: Nurse Practitioner

## 2018-05-16 DIAGNOSIS — F3342 Major depressive disorder, recurrent, in full remission: Secondary | ICD-10-CM

## 2018-06-25 ENCOUNTER — Other Ambulatory Visit: Payer: Self-pay | Admitting: Nurse Practitioner

## 2018-06-25 DIAGNOSIS — I10 Essential (primary) hypertension: Secondary | ICD-10-CM

## 2018-06-29 ENCOUNTER — Other Ambulatory Visit: Payer: Self-pay | Admitting: Nurse Practitioner

## 2018-06-29 DIAGNOSIS — I10 Essential (primary) hypertension: Secondary | ICD-10-CM

## 2018-07-15 ENCOUNTER — Other Ambulatory Visit (INDEPENDENT_AMBULATORY_CARE_PROVIDER_SITE_OTHER): Payer: Medicare Other

## 2018-07-15 ENCOUNTER — Other Ambulatory Visit: Payer: Self-pay | Admitting: Orthopedic Surgery

## 2018-07-15 DIAGNOSIS — R52 Pain, unspecified: Secondary | ICD-10-CM

## 2018-07-15 DIAGNOSIS — M25531 Pain in right wrist: Secondary | ICD-10-CM | POA: Diagnosis not present

## 2018-08-17 ENCOUNTER — Other Ambulatory Visit: Payer: Self-pay | Admitting: Nurse Practitioner

## 2018-08-17 DIAGNOSIS — F3342 Major depressive disorder, recurrent, in full remission: Secondary | ICD-10-CM

## 2018-09-03 ENCOUNTER — Other Ambulatory Visit: Payer: Self-pay | Admitting: Nurse Practitioner

## 2018-09-03 DIAGNOSIS — E7800 Pure hypercholesterolemia, unspecified: Secondary | ICD-10-CM

## 2018-09-03 NOTE — Telephone Encounter (Signed)
Last seen 04/07/18   Last lipid 05/21/17

## 2018-09-22 ENCOUNTER — Other Ambulatory Visit: Payer: Self-pay | Admitting: Nurse Practitioner

## 2018-09-22 DIAGNOSIS — I10 Essential (primary) hypertension: Secondary | ICD-10-CM

## 2018-10-19 ENCOUNTER — Ambulatory Visit: Payer: Medicare Other | Admitting: Nurse Practitioner

## 2018-10-22 ENCOUNTER — Encounter: Payer: Self-pay | Admitting: Nurse Practitioner

## 2018-10-22 ENCOUNTER — Ambulatory Visit: Payer: Medicare Other | Admitting: Nurse Practitioner

## 2018-10-22 VITALS — BP 148/73 | HR 47 | Temp 97.1°F | Ht 66.0 in | Wt 172.0 lb

## 2018-10-22 DIAGNOSIS — I471 Supraventricular tachycardia: Secondary | ICD-10-CM

## 2018-10-22 DIAGNOSIS — E7800 Pure hypercholesterolemia, unspecified: Secondary | ICD-10-CM | POA: Diagnosis not present

## 2018-10-22 DIAGNOSIS — Z6826 Body mass index (BMI) 26.0-26.9, adult: Secondary | ICD-10-CM

## 2018-10-22 DIAGNOSIS — F3342 Major depressive disorder, recurrent, in full remission: Secondary | ICD-10-CM

## 2018-10-22 DIAGNOSIS — E1065 Type 1 diabetes mellitus with hyperglycemia: Secondary | ICD-10-CM

## 2018-10-22 DIAGNOSIS — I10 Essential (primary) hypertension: Secondary | ICD-10-CM | POA: Diagnosis not present

## 2018-10-22 DIAGNOSIS — Z23 Encounter for immunization: Secondary | ICD-10-CM | POA: Diagnosis not present

## 2018-10-22 MED ORDER — METOPROLOL TARTRATE 25 MG PO TABS: 12.5000 mg | ORAL_TABLET | Freq: Two times a day (BID) | ORAL | 1 refills | Status: DC

## 2018-10-22 MED ORDER — VALSARTAN-HYDROCHLOROTHIAZIDE 160-12.5 MG PO TABS: 1.0000 | ORAL_TABLET | Freq: Every day | ORAL | 1 refills | Status: DC

## 2018-10-22 MED ORDER — SERTRALINE HCL 50 MG PO TABS: 50.0000 mg | ORAL_TABLET | Freq: Every day | ORAL | 1 refills | Status: DC

## 2018-10-22 MED ORDER — SIMVASTATIN 40 MG PO TABS: 40.0000 mg | ORAL_TABLET | Freq: Every day | ORAL | 1 refills | Status: DC

## 2018-10-22 NOTE — Patient Instructions (Signed)

## 2018-10-22 NOTE — Progress Notes (Signed)
Subjective:    Patient ID: Jean [name], female    DOB: 01-15-1951, 67 y.o.   MRN: [id number]   Chief Complaint: medical management of chronic issues  HPI:  1. SVT (supraventricular tachycardia) (HCC)  Has runs of SVT every couple of weeks. She sees cardiologist yearly. She is usually able to get heart rate down with valsalva manuver.  2. Essential hypertension  No c/o chest pain, sob or headache. Does not check blood pressure at home. BP Readings from Last 3 Encounters:  10/22/18 (!) 148/73  04/07/18 115/82  05/21/17 128/70     3. Pure hypercholesterolemia  Tries to watch diet. Walks several times a week  4. BMI 26.0-26.9,adult  No recent weight changes  5. Recurrent major depressive disorder, in full remission ([place])  Is on zoloft daily she says it helps with her worrying. Depression screen [facility] 2/9 10/22/2018 05/21/2017 05/16/2016  Decreased Interest 0 0 0  Down, Depressed, Hopeless 0 0 0  PHQ - 2 Score 0 0 0        Outpatient Encounter Medications as of 10/22/2018  Medication Sig  . aspirin 81 MG tablet Take 81 mg by mouth daily.    Marland Kitchen ezetimibe (ZETIA) 10 MG tablet TAKE 1 TABLET BY MOUTH EVERY DAY  . Fexofenadine HCl (ALLEGRA PO) Take by mouth.  . metoprolol tartrate (LOPRESSOR) 25 MG tablet Take 0.5 tablets (12.5 mg total) by mouth 2 (two) times daily.  . sertraline (ZOLOFT) 50 MG tablet TAKE 1 TABLET BY MOUTH EVERY DAY  . simvastatin (ZOCOR) 20 MG tablet TAKE 1 TABLET (20 MG TOTAL) BY MOUTH DAILY AT 6 PM.  . valsartan-hydrochlorothiazide (DIOVAN-HCT) 160-12.5 MG tablet Take 1 tablet by mouth daily. (Needs to be seen before next refill)       New complaints: None today  Social history: Lives with husband   Review of Systems  Constitutional: Negative for activity change and appetite change.  HENT: Negative.   Eyes: Negative for pain.  Respiratory: Negative for shortness of breath.   Cardiovascular: Negative for chest pain, palpitations and leg swelling.   Gastrointestinal: Negative for abdominal pain.  Endocrine: Negative for polydipsia.  Genitourinary: Negative.   Skin: Negative for rash.  Neurological: Negative for dizziness, weakness and headaches.  Hematological: Does not bruise/bleed easily.  Psychiatric/Behavioral: Negative.   All other systems reviewed and are negative.      Objective:   Physical Exam  Constitutional: She is oriented to person, place, and time. She appears well-developed and well-nourished. No distress.  HENT:  Head: Normocephalic.  Nose: Nose normal.  Mouth/Throat: Oropharynx is clear and moist.  Eyes: Pupils are equal, round, and reactive to light. EOM are normal.  Neck: Normal range of motion. Neck supple. No JVD present. Carotid bruit is not present.  Cardiovascular: Normal rate, regular rhythm, normal heart sounds and intact distal pulses.  Pulmonary/Chest: Effort normal and breath sounds normal. No respiratory distress. She has no wheezes. She has no rales. She exhibits no tenderness.  Abdominal: Soft. Normal appearance, normal aorta and bowel sounds are normal. She exhibits no distension, no abdominal bruit, no pulsatile midline mass and no mass. There is no splenomegaly or hepatomegaly. There is no tenderness.  Musculoskeletal: Normal range of motion. She exhibits no edema.  Lymphadenopathy:    She has no cervical adenopathy.  Neurological: She is alert and oriented to person, place, and time. She has normal reflexes.  Skin: Skin is warm and dry.  Psychiatric: She has a normal mood and  affect. Her behavior is normal. Judgment and thought content normal.  Nursing note and vitals reviewed.   BP (!) 148/73   Pulse (!) 47   Temp (!) 97.1 F (36.2 C) (Oral)   Ht '5\' 6"'$  (1.676 m)   Wt 172 lb (78 kg)   BMI 27.76 kg/m        Assessment & Plan:  RASHA IBE comes in today with chief complaint of Medical Management of Chronic Issues   Diagnosis and orders addressed:  1. SVT  (supraventricular tachycardia) (HCC) Avoid food and drinks that trigger episodes - metoprolol tartrate (LOPRESSOR) 25 MG tablet; Take 0.5 tablets (12.5 mg total) by mouth 2 (two) times daily.  Dispense: 90 tablet; Refill: 1  2. Essential hypertension Low sodium diet - valsartan-hydrochlorothiazide (DIOVAN-HCT) 160-12.5 MG tablet; Take 1 tablet by mouth daily. (Needs to be seen before next refill)  Dispense: 90 tablet; Refill: 1 - CMP14+EGFR  3. Pure hypercholesterolemia Low fat diet - simvastatin (ZOCOR) 40 MG tablet; Take 1 tablet (40 mg total) by mouth at bedtime.  Dispense: 90 tablet; Refill: 1 - Lipid panel  4. BMI 26.0-26.9,adult Discussed diet and exercise for person with BMI >25 Will recheck weight in 3-6 months  5. Recurrent major depressive disorder, in full remission ([place]) Stress management - sertraline (ZOLOFT) 50 MG tablet; Take 1 tablet (50 mg total) by mouth daily.  Dispense: 90 tablet; Refill: 1   Labs pending Health Maintenance reviewed Diet and exercise encouraged  Follow up plan: 6 months   Mary-Margaret Hassell Done, FNP

## 2018-10-22 NOTE — Addendum Note (Signed)
Addended by: Rolena Infante on: 10/22/2018 04:41 PM   Modules accepted: Orders

## 2018-10-23 LAB — CMP14+EGFR
A/G RATIO: 1.6 (ref 1.2–2.2)
ALT: 20 IU/L (ref 0–32)
AST: 20 IU/L (ref 0–40)
Albumin: 4.3 g/dL (ref 3.6–4.8)
Alkaline Phosphatase: 102 IU/L (ref 39–117)
BUN/Creatinine Ratio: 22 (ref 12–28)
BUN: 17 mg/dL (ref 8–27)
Bilirubin Total: 0.4 mg/dL (ref 0.0–1.2)
CALCIUM: 9.4 mg/dL (ref 8.7–10.3)
CHLORIDE: 102 mmol/L (ref 96–106)
CO2: 25 mmol/L (ref 20–29)
Creatinine, Ser: 0.76 mg/dL (ref 0.57–1.00)
GFR calc Af Amer: 93 mL/min/{1.73_m2} (ref >59)
GFR calc non Af Amer: 81 mL/min/{1.73_m2} (ref >59)
Globulin, Total: 2.7 g/dL (ref 1.5–4.5)
Glucose: 116 mg/dL — ABNORMAL HIGH (ref 65–99)
POTASSIUM: 3.9 mmol/L (ref 3.5–5.2)
Sodium: 140 mmol/L (ref 134–144)
TOTAL PROTEIN: 7 g/dL (ref 6.0–8.5)

## 2018-10-23 LAB — LIPID PANEL
CHOL/HDL RATIO: 4.1 ratio (ref 0.0–4.4)
Cholesterol, Total: 180 mg/dL (ref 100–199)
HDL: 44 mg/dL (ref >39)
LDL CALC: 107 mg/dL — AB (ref 0–99)
Triglycerides: 146 mg/dL (ref 0–149)
VLDL CHOLESTEROL CAL: 29 mg/dL (ref 5–40)

## 2018-10-26 NOTE — Addendum Note (Signed)
Addended by: Chevis Pretty on: 10/26/2018 01:28 PM   Modules accepted: Orders

## 2018-10-28 LAB — BAYER DCA HB A1C WAIVED: HB A1C: 5.7 % (ref <7.0)

## 2019-02-10 ENCOUNTER — Telehealth: Payer: Self-pay | Admitting: Nurse Practitioner

## 2019-02-10 NOTE — Telephone Encounter (Signed)
Patient declined AWV 

## 2019-04-19 ENCOUNTER — Other Ambulatory Visit: Payer: Self-pay | Admitting: Nurse Practitioner

## 2019-04-19 DIAGNOSIS — I471 Supraventricular tachycardia: Secondary | ICD-10-CM

## 2019-04-23 ENCOUNTER — Other Ambulatory Visit: Payer: Self-pay | Admitting: Nurse Practitioner

## 2019-04-23 DIAGNOSIS — E7800 Pure hypercholesterolemia, unspecified: Secondary | ICD-10-CM

## 2019-04-28 ENCOUNTER — Telehealth: Payer: Self-pay | Admitting: Cardiology

## 2019-04-28 NOTE — Telephone Encounter (Signed)
LVM regarding video or phone visit. °

## 2019-04-30 ENCOUNTER — Other Ambulatory Visit: Payer: Self-pay | Admitting: Nurse Practitioner

## 2019-04-30 DIAGNOSIS — I10 Essential (primary) hypertension: Secondary | ICD-10-CM

## 2019-05-02 ENCOUNTER — Telehealth (INDEPENDENT_AMBULATORY_CARE_PROVIDER_SITE_OTHER): Payer: Medicare Other | Admitting: Physician Assistant

## 2019-05-02 VITALS — BP 126/78 | Ht 66.0 in | Wt 172.0 lb

## 2019-05-02 DIAGNOSIS — R5383 Other fatigue: Secondary | ICD-10-CM

## 2019-05-02 DIAGNOSIS — I10 Essential (primary) hypertension: Secondary | ICD-10-CM

## 2019-05-02 DIAGNOSIS — E785 Hyperlipidemia, unspecified: Secondary | ICD-10-CM

## 2019-05-02 DIAGNOSIS — R002 Palpitations: Secondary | ICD-10-CM

## 2019-05-02 DIAGNOSIS — I471 Supraventricular tachycardia: Secondary | ICD-10-CM

## 2019-05-02 NOTE — Progress Notes (Signed)
Virtual Visit via Telephone Note   This visit type was conducted due to national recommendations for restrictions regarding the COVID-19 Pandemic (e.g. social distancing) in an effort to limit this patient's exposure and mitigate transmission in our community.  Due to her co-morbid illnesses, this patient is at least at moderate risk for complications without adequate follow up.  This format is felt to be most appropriate for this patient at this time.  The patient did not have access to video technology/had technical difficulties with video requiring transitioning to audio format only (telephone).  All issues noted in this document were discussed and addressed.  No physical exam could be performed with this format.  Please refer to the patient's chart for her  consent to telehealth for [facility].   Date:  05/02/2019   ID:  Jean [name], DOB 1951-07-06, MRN [id number]  Patient Location: Home Provider Location: Home  PCP:  Chevis Pretty, FNP  Cardiologist:  No primary care provider on file.  Electrophysiologist:  None   Evaluation Performed:  Follow-Up Visit  Chief Complaint:  followup  History of Present Illness:    Jean [name] is a 68 y.o. female with past medical history of hypertension, hyperlipidemia, SVT and PVCs.  Echocardiogram in 2012 showed normal LV function.  Event monitor in 2012 showed predominantly PVC with only 4 beats run of PAT.  Her last office visit with Dr. Martinique was on 04/07/2018 at which time she was doing well.  She is on a very low-dose of metoprolol tartrate to help controlling the palpitation.  Jean [name] has been contacted today via telephone visit.  According to her, since January, she has been noticing increasing palpitations.  This sometimes last for days at a time.  She is unable to describe to me whether the symptom is more related to skipped heartbeat versus persistent irregular rhythm.  For the past week, she has doubled up on her  metoprolol to 25 mg twice daily.  Looking back, even on the 12.5 mg metoprolol, she had a history of borderline bradycardia.  Her heart rate during last cardiology office visit was 53 bpm, and during her last PCPs visit, her heart rate was as low as in the 40s.  I asked her to go back on 12.5 mg twice daily of metoprolol.  I will obtain a TSH to make sure she is not having any thyroid issue.  I also asked her to cut back on caffeine.  She drinks about 1 cup of coffee every morning.  I plan to obtain a 2-week Zio monitor, she can follow-up with Dr. Martinique in 5 to 6 weeks.  The patient does not have symptoms concerning for COVID-19 infection (fever, chills, cough, or new shortness of breath).    Past Medical History:  Diagnosis Date  . Gestational diabetes    WITH HER CHILDBIRTH  . Hyperlipidemia   . Hypertension   . Mitral insufficiency    TRIVIAL  . Palpitations   . PVC's (premature ventricular contractions) 03/16/2014  . SVT (supraventricular tachycardia) (HCC)    Past Surgical History:  Procedure Laterality Date  . US ECHOCARDIOGRAPHY     EF 60%     Current Meds  Medication Sig  . Fexofenadine HCl (ALLEGRA PO) Take by mouth.  . metoprolol tartrate (LOPRESSOR) 25 MG tablet TAKE 0.5 TABLETS (12.5 MG TOTAL) BY MOUTH 2 (TWO) TIMES DAILY.  Marland Kitchen sertraline (ZOLOFT) 50 MG tablet Take 1 tablet (50 mg total) by mouth daily.  Marland Kitchen  simvastatin (ZOCOR) 40 MG tablet Take 1 tablet (40 mg total) by mouth at bedtime. (Please make your 6 mos follow-up)  . valsartan-hydrochlorothiazide (DIOVAN-HCT) 160-12.5 MG tablet TAKE 1 TABLET BY MOUTH DAILY. (NEEDS TO BE SEEN BEFORE NEXT REFILL)     Allergies:   Patient has no known allergies.   Social History   Tobacco Use  . Smoking status: Never Smoker  . Smokeless tobacco: Never Used  Substance Use Topics  . Alcohol use: No  . Drug use: No     Family Hx: The patient's family history includes Anemia in her paternal grandmother; Diabetes in her sister;  Heart disease in her mother; Hypertension in her sister.  ROS:   Please see the history of present illness.     All other systems reviewed and are negative.   Prior CV studies:   The following studies were reviewed today:  Echo 07/15/2011 Study Conclusions  - Left ventricle: The cavity size was normal. Wall thickness was normal. Systolic function was normal. The estimated ejection fraction was in the range of 60% to 65%. Wall motion was normal; there were no regional wall motion abnormalities. Left ventricular diastolic function parameters were normal. - Mitral valve: Mild regurgitation. - Atrial septum: No defect or patent foramen ovale was identified. - Pulmonic valve: Mild increase in gradient. Valve appears redundant but not particularly thickened or restricted in motion Peak gradient: 52mm Hg (S).  Labs/Other Tests and Data Reviewed:    EKG:  An ECG dated 04/07/2018 was personally reviewed today and demonstrated:  Sinus bradycardia, heart rate 53.  Recent Labs: 10/22/2018: ALT 20; BUN 17; Creatinine, Ser 0.76; Potassium 3.9; Sodium 140   Recent Lipid Panel Lab Results  Component Value Date/Time   CHOL 180 10/22/2018 03:36 PM   TRIG 146 10/22/2018 03:36 PM   TRIG 100 02/28/2015 09:23 AM   HDL 44 10/22/2018 03:36 PM   HDL 41 02/28/2015 09:23 AM   CHOLHDL 4.1 10/22/2018 03:36 PM   LDLCALC 107 (H) 10/22/2018 03:36 PM   LDLCALC 62 01/05/2014 08:00 AM    Wt Readings from Last 3 Encounters:  05/02/19 172 lb (78 kg)  10/22/18 172 lb (78 kg)  04/07/18 166 lb 12.8 oz (75.7 kg)     Objective:    Vital Signs:  BP 126/78   Ht 5\' 6"  (1.676 m)   Wt 172 lb (78 kg)   BMI 27.76 kg/m    VITAL SIGNS:  reviewed  ASSESSMENT & PLAN:    1. Palpitation: Although previous monitor demonstrated a H transient episode of SVT, patient has been having more palpitation recently despite a 12.5 mg twice daily of metoprolol.  Since about a week ago, she has self increased  metoprolol to 25 mg twice daily without improvement.  I asked her to cut back on caffeine.  I will check a thyroid panel.  I plan to arrange 2-week Zio monitor during the meantime.  Given her history of bradycardia on the 12.5 mg twice daily of metoprolol, I recommended her to go back to 12.5 mg twice a day instead of the current 25 mg twice a day.  Once we identified the type of rhythm, we can better approach this issue.  2. Hypertension: Blood pressure stable on current therapy.  She has self increased her metoprolol to 25 mg twice daily for the past week due to palpitation.  Given her prior history of bradycardia on the lower dose of metoprolol, I did ask her to go back to 12.5  mg twice daily.  3. Hyperlipidemia: Continue Zocor.  COVID-19 Education: The signs and symptoms of COVID-19 were discussed with the patient and how to seek care for testing (follow up with PCP or arrange E-visit).  The importance of social distancing was discussed today.  Time:   Today, I have spent 19 minutes with the patient with telehealth technology discussing the above problems.     Medication Adjustments/Labs and Tests Ordered: Current medicines are reviewed at length with the patient today.  Concerns regarding medicines are outlined above.   Tests Ordered: No orders of the defined types were placed in this encounter.   Medication Changes: No orders of the defined types were placed in this encounter.   Disposition:  Follow up in 6 week(s)  Signed, Almyra Deforest, PA  05/02/2019 9:53 AM    [facility]

## 2019-05-02 NOTE — Patient Instructions (Addendum)
Medication Instructions:   Continue taking Metoprolol Tartrate 12.5 mg 2 times a day   If you need a refill on your cardiac medications before your next appointment, please call your pharmacy.   Lab work:  You will need to have labs (bood work)  TSH  If you have labs (blood work) drawn today and your tests are completely normal, you will receive your results only by: Marland Kitchen MyChart Message (if you have MyChart) OR . A paper copy in the mail If you have any lab test that is abnormal or we need to change your treatment, we will call you to review the results.  Testing/Procedures:  Your physician has recommended that you wear a 14 DAY ZIO-PATCH monitor. The Zio patch cardiac monitor continuously records heart rhythm data for up to 14 days, this is for patients being evaluated for multiple types heart rhythms. For the first 24 hours post application, please avoid getting the Zio monitor wet in the shower or by excessive sweating during exercise. After that, feel free to carry on with regular activities. Keep soaps and lotions away from the ZIO XT Patch.  This will be placed at our [facility] location - [address], [address].        Follow-Up: At [facility], you and your health needs are our priority.  As part of our continuing mission to provide you with exceptional heart care, we have created designated Provider Care Teams.  These Care Teams include your primary Cardiologist (physician) and Advanced Practice Providers (APPs -  Physician Assistants and Nurse Practitioners) who all work together to provide you with the care you need, when you need it. You will need a follow up appointment in 6 weeks.  Please call our office 2 months in advance to schedule this appointment.  You may see Peter Martinique, MD or one of the following Advanced Practice Providers on your designated Care Team: [place], [place] . Fabian Sharp, PA-C  Any Other Special Instructions Will Be Listed Below (If Applicable).   Cut back on your caffeine intake

## 2019-05-04 ENCOUNTER — Telehealth: Payer: Medicare Other | Admitting: Cardiology

## 2019-05-12 ENCOUNTER — Other Ambulatory Visit: Payer: Self-pay

## 2019-05-12 ENCOUNTER — Telehealth: Payer: Self-pay | Admitting: Physician Assistant

## 2019-05-12 DIAGNOSIS — R002 Palpitations: Secondary | ICD-10-CM

## 2019-05-12 NOTE — Telephone Encounter (Signed)
Left phone message - the monitor has  Been ordered- office will contact you when the monitor will be mailed. Any question may all back

## 2019-05-12 NOTE — Telephone Encounter (Signed)
New message    Pt came by the office today about her heart monitor. Pt had AVS with her from virtual visit that said she would be mailed the monitor, but there is no order in epic.

## 2019-05-12 NOTE — Addendum Note (Signed)
Addended by: Jacqulynn Cadet on: 05/12/2019 03:35 PM   Modules accepted: Orders

## 2019-05-13 LAB — TSH: TSH: 1.53 u[IU]/mL (ref 0.450–4.500)

## 2019-05-27 ENCOUNTER — Telehealth: Payer: Self-pay | Admitting: Cardiology

## 2019-05-27 ENCOUNTER — Other Ambulatory Visit: Payer: Self-pay | Admitting: Nurse Practitioner

## 2019-05-27 DIAGNOSIS — I10 Essential (primary) hypertension: Secondary | ICD-10-CM

## 2019-05-27 NOTE — Telephone Encounter (Signed)
ntbs for refill 30 day supply given 04/30/19

## 2019-05-27 NOTE — Telephone Encounter (Signed)
New message:    Patient calling concerning her monitor that she have not received it has been 4 weeks. Please call patient concering this appt.

## 2019-05-27 NOTE — Telephone Encounter (Signed)
Pt scheduled with MMM 06/21/19 at 12:15 for refills.

## 2019-05-30 ENCOUNTER — Telehealth: Payer: Self-pay | Admitting: *Deleted

## 2019-05-30 NOTE — Telephone Encounter (Signed)
Apologized for delay of ordering monitor for patient.  Monitor department not notified a monitor had been requested. 14 day ZIO XT long term holter monitor will be mailed to the patients home. Instructions reviewed briefly as they are included in the monitor kit.

## 2019-06-03 ENCOUNTER — Ambulatory Visit (INDEPENDENT_AMBULATORY_CARE_PROVIDER_SITE_OTHER): Payer: Medicare Other

## 2019-06-03 DIAGNOSIS — R002 Palpitations: Secondary | ICD-10-CM | POA: Diagnosis not present

## 2019-06-05 ENCOUNTER — Other Ambulatory Visit: Payer: Self-pay | Admitting: Nurse Practitioner

## 2019-06-05 DIAGNOSIS — F3342 Major depressive disorder, recurrent, in full remission: Secondary | ICD-10-CM

## 2019-06-20 NOTE — Progress Notes (Signed)
Virtual Visit via telephone Note  I connected with Jean [name] on 06/20/19 at 9:00 by telephone and verified that I am speaking with the correct person using two identifiers. Jean [name] is currently located at home and no one is currently with her during visit. The provider, Mary-Margaret Hassell Done, FNP is located in their office at time of visit.  I discussed the limitations, risks, security and privacy concerns of performing an evaluation and management service by telephone and the availability of in person appointments. I also discussed with the patient that there may be a patient responsible charge related to this service. The patient expressed understanding and agreed to proceed.   History and Present Illness:   Chief Complaint: medical management of chronic issues   HPI:  1. Essential hypertension No c/o chest pain, sob or headache. does not check blood pressure at home. BP Readings from Last 3 Encounters:  05/02/19 126/78  10/22/18 (!) 148/73  04/07/18 115/82    2. Pure hypercholesterolemia Does really does watch diet and does exercise class 2 days a week and walks the other days.  3. Recurrent major depressive disorder, in full remission ([place]) Is on zoloft and is doing well. Denies any side effects from medication  4. SVT (supraventricular tachycardia) ([place]) Has been going in an out of SVT. Saw Dr. Martinique and he placed a heart monitor on her and she just returned it last Friday , so she has not got results yet. She has stopped all caffeine.  5. BMI 26.0-26.9,adult No recent weight changes    Outpatient Encounter Medications as of 06/21/2019  Medication Sig  . Fexofenadine HCl (ALLEGRA PO) Take by mouth.  . metoprolol tartrate (LOPRESSOR) 25 MG tablet TAKE 0.5 TABLETS (12.5 MG TOTAL) BY MOUTH 2 (TWO) TIMES DAILY.  Marland Kitchen sertraline (ZOLOFT) 50 MG tablet TAKE 1 TABLET BY MOUTH EVERY DAY  . simvastatin (ZOCOR) 40 MG tablet Take 1 tablet (40 mg total) by mouth at  bedtime. (Please make your 6 mos follow-up)  . valsartan-hydrochlorothiazide (DIOVAN-HCT) 160-12.5 MG tablet TAKE 1 TABLET BY MOUTH DAILY. (NEEDS TO BE SEEN BEFORE NEXT REFILL)     Past Surgical History:  Procedure Laterality Date  . US ECHOCARDIOGRAPHY     EF 60%    Family History  Problem Relation Age of Onset  . Heart disease Mother   . Hypertension Sister   . Diabetes Sister   . Anemia Paternal Grandmother     New complaints: None today  Social history: Lives with husband      Review of Systems  Constitutional: Negative.   HENT: Negative.   Respiratory: Negative.   Cardiovascular: Negative.   Genitourinary: Negative.   Neurological: Negative.   Psychiatric/Behavioral: Negative.   All other systems reviewed and are negative.    Observations/Objective: Alert and oriented- answers all questions apporpriately No distress Currently not in SVT  Assessment and Plan: Jean [name] comes in today with chief complaint of No chief complaint on file.   Diagnosis and orders addressed:  1. Essential hypertension Low  Sodium diet - valsartan-hydrochlorothiazide (DIOVAN-HCT) 160-12.5 MG tablet; Take 1 tablet by mouth daily. (Needs to be seen before next refill)  Dispense: 30 tablet; Refill: 0  2. Pure hypercholesterolemia Low fat diet - simvastatin (ZOCOR) 40 MG tablet; Take 1 tablet (40 mg total) by mouth at bedtime. (Please make your 6 mos follow-up)  Dispense: 90 tablet; Refill: 0  3. Recurrent major depressive disorder, in full remission [facility]) Stress management -  sertraline (ZOLOFT) 50 MG tablet; Take 1 tablet (50 mg total) by mouth daily.  Dispense: 90 tablet; Refill: 1  4. SVT (supraventricular tachycardia) (HCC) Continue to avoid caffeine Will be watching for report from cardiology - metoprolol tartrate (LOPRESSOR) 25 MG tablet; Take 0.5 tablets (12.5 mg total) by mouth 2 (two) times daily.  Dispense: 90 tablet; Refill: 0  5. BMI  26.0-26.9,adult Discussed diet and exercise for person with BMI >25 Will recheck weight in 3-6 months   Labs pending Health Maintenance reviewed Diet and exercise encouraged  Follow up plan: 6 months     I discussed the assessment and treatment plan with the patient. The patient was provided an opportunity to ask questions and all were answered. The patient agreed with the plan and demonstrated an understanding of the instructions.  The patient was advised to [name] back or seek an in-person evaluation if the symptoms worsen or if the condition fails to improve as anticipated.  The above assessment and management plan was discussed with the patient. The patient verbalized understanding of and has agreed to the management plan. Patient is aware to [name] the clinic if symptoms persist or worsen. Patient is aware when to return to the clinic for a follow-up visit. Patient educated on when it is appropriate to go to the emergency department.   Time [name] ended:  9:17  I provided 17 minutes of non-face-to-face time during this encounter.    Mary-Margaret Hassell Done, FNP

## 2019-06-21 ENCOUNTER — Other Ambulatory Visit: Payer: Self-pay

## 2019-06-21 ENCOUNTER — Encounter: Payer: Self-pay | Admitting: Nurse Practitioner

## 2019-06-21 ENCOUNTER — Ambulatory Visit (INDEPENDENT_AMBULATORY_CARE_PROVIDER_SITE_OTHER): Payer: Medicare Other | Admitting: Nurse Practitioner

## 2019-06-21 DIAGNOSIS — F3342 Major depressive disorder, recurrent, in full remission: Secondary | ICD-10-CM

## 2019-06-21 DIAGNOSIS — E7800 Pure hypercholesterolemia, unspecified: Secondary | ICD-10-CM

## 2019-06-21 DIAGNOSIS — I10 Essential (primary) hypertension: Secondary | ICD-10-CM

## 2019-06-21 DIAGNOSIS — I471 Supraventricular tachycardia: Secondary | ICD-10-CM | POA: Diagnosis not present

## 2019-06-21 DIAGNOSIS — Z6826 Body mass index (BMI) 26.0-26.9, adult: Secondary | ICD-10-CM

## 2019-06-21 MED ORDER — VALSARTAN-HYDROCHLOROTHIAZIDE 160-12.5 MG PO TABS: 1.0000 | ORAL_TABLET | Freq: Every day | ORAL | 0 refills | Status: DC

## 2019-06-21 MED ORDER — SERTRALINE HCL 50 MG PO TABS: 50.0000 mg | ORAL_TABLET | Freq: Every day | ORAL | 1 refills | Status: DC

## 2019-06-21 MED ORDER — SIMVASTATIN 40 MG PO TABS: 40.0000 mg | ORAL_TABLET | Freq: Every day | ORAL | 0 refills | Status: DC

## 2019-06-21 MED ORDER — METOPROLOL TARTRATE 25 MG PO TABS: 12.5000 mg | ORAL_TABLET | Freq: Two times a day (BID) | ORAL | 0 refills | Status: DC

## 2019-06-28 ENCOUNTER — Other Ambulatory Visit: Payer: Self-pay

## 2019-06-28 DIAGNOSIS — I4891 Unspecified atrial fibrillation: Secondary | ICD-10-CM

## 2019-06-28 MED ORDER — METOPROLOL TARTRATE 25 MG PO TABS: 25.0000 mg | ORAL_TABLET | Freq: Two times a day (BID) | ORAL | 3 refills | Status: DC

## 2019-06-28 MED ORDER — APIXABAN 5 MG PO TABS: 5.0000 mg | ORAL_TABLET | Freq: Two times a day (BID) | ORAL | 6 refills | Status: DC

## 2019-06-29 ENCOUNTER — Telehealth: Payer: Self-pay | Admitting: Cardiology

## 2019-06-29 NOTE — Telephone Encounter (Signed)
LVM for patient to call and schedule echo. °

## 2019-06-29 NOTE — Telephone Encounter (Signed)
LVM for pt to call and schedule echo.

## 2019-07-04 ENCOUNTER — Ambulatory Visit (HOSPITAL_COMMUNITY): Payer: Medicare Other | Attending: Cardiology

## 2019-07-04 ENCOUNTER — Other Ambulatory Visit: Payer: Self-pay

## 2019-07-04 DIAGNOSIS — I4891 Unspecified atrial fibrillation: Secondary | ICD-10-CM | POA: Insufficient documentation

## 2019-07-08 NOTE — Progress Notes (Signed)
Jean [name] Date of Birth: 1951-07-19   History of Present Illness: [name] is seen today for yearly followup. She has a history of SVT in the past and PVCs. Echo in 2012 showed normal LV function. Event monitor then showed predominantly PVCs with only a 4 beat run of PAT.   She was seen by Almyra Deforest PA-C for a televisit in May with complaints of increased palpitations. Zio monitor was placed. This demonstrated Afib with RVR rate ranging from 64-196. Burden 11%. Average HR 124. Nonsustained runs of atrial tachycardia. Metoprolol increased to 25 mg bid. Started on Eliquis and ASA discontinued. Echo showed normal EF with mild AI and mild LVH.   Since increasing her metoprolol and eliminating caffeine intake she is doing better with reduced palpitations. No dizziness or syncope. Notes a little more fatigue.     Current Outpatient Medications on File Prior to Visit  Medication Sig Dispense Refill  . apixaban (ELIQUIS) 5 MG TABS tablet Take 1 tablet (5 mg total) by mouth 2 (two) times daily. 60 tablet 6  . Fexofenadine HCl (ALLEGRA PO) Take by mouth.    . metoprolol tartrate (LOPRESSOR) 25 MG tablet Take 1 tablet (25 mg total) by mouth 2 (two) times daily. 180 tablet 3  . sertraline (ZOLOFT) 50 MG tablet Take 1 tablet (50 mg total) by mouth daily. 90 tablet 1  . simvastatin (ZOCOR) 40 MG tablet Take 1 tablet (40 mg total) by mouth at bedtime. (Please make your 6 mos follow-up) 90 tablet 0  . valsartan-hydrochlorothiazide (DIOVAN-HCT) 160-12.5 MG tablet Take 1 tablet by mouth daily. (Needs to be seen before next refill) 30 tablet 0   No current facility-administered medications on file prior to visit.     No Known Allergies  Past Medical History:  Diagnosis Date  . Gestational diabetes    WITH HER CHILDBIRTH  . Hyperlipidemia   . Hypertension   . Mitral insufficiency    TRIVIAL  . Palpitations   . PVC's (premature ventricular contractions) 03/16/2014  . SVT (supraventricular  tachycardia) (HCC)     Past Surgical History:  Procedure Laterality Date  . US ECHOCARDIOGRAPHY     EF 60%    Social History   Tobacco Use  Smoking Status Never Smoker  Smokeless Tobacco Never Used    Social History   Substance and Sexual Activity  Alcohol Use No    Family History  Problem Relation Age of Onset  . Heart disease Mother   . Hypertension Sister   . Diabetes Sister   . Anemia Paternal Grandmother     Review of Systems: As noted in HPI. All other systems were reviewed and are negative.  Physical Exam: BP 118/72   Pulse (!) 54   Temp (!) 97.2 F (36.2 C)   Ht 5\' 6"  (1.676 m)   Wt 169 lb 6.4 oz (76.8 kg)   SpO2 97%   BMI 27.34 kg/m  GENERAL:  Well appearing WF in NAD HEENT:  PERRL, EOMI, sclera are clear. Oropharynx is clear. NECK:  No jugular venous distention, carotid upstroke brisk and symmetric, no bruits, no thyromegaly or adenopathy LUNGS:  Clear to auscultation bilaterally CHEST:  Unremarkable HEART:  RRR,  PMI not displaced or sustained,S1 and S2 within normal limits, no S3, no S4: no clicks, no rubs, no murmurs ABD:  Soft, nontender. BS +, no masses or bruits. No hepatomegaly, no splenomegaly EXT:  2 + pulses throughout, no edema, no cyanosis no clubbing SKIN:  Warm and dry.  No rashes NEURO:  Alert and oriented x 3. Cranial nerves II through XII intact. PSYCH:  Cognitively intact    LABORATORY DATA: ECG today demonstrates sinus bradycardia with a rate of 50. beats per minute. It is otherwise normal. I have personally reviewed and interpreted this study.   Lab Results  Component Value Date   WBC 6.1 07/08/2011   HGB 15.1 (H) 07/08/2011   HCT 44.3 07/08/2011   PLT 174.0 07/08/2011   GLUCOSE 116 (H) 10/22/2018   CHOL 180 10/22/2018   TRIG 146 10/22/2018   HDL 44 10/22/2018   LDLCALC 107 (H) 10/22/2018   ALT 20 10/22/2018   AST 20 10/22/2018   NA 140 10/22/2018   K 3.9 10/22/2018   CL 102 10/22/2018   CREATININE 0.76  10/22/2018   BUN 17 10/22/2018   CO2 25 10/22/2018   TSH 1.530 05/12/2019   HGBA1C 5.7 10/28/2018   Echo 07/04/19: IMPRESSIONS    1. The left ventricle has normal systolic function with an ejection fraction of 60-65%. The cavity size was normal. There is mildly increased left ventricular wall thickness. Left ventricular diastolic Doppler parameters are indeterminate.  2. The right ventricle has normal systolic function. The cavity was normal. There is no increase in right ventricular wall thickness.  3. The aortic valve is tricuspid. Mild thickening of the aortic valve. Aortic valve regurgitation is mild by color flow Doppler.  Zio monitor: 06/28/19: Study Highlights   NSR  Atrial fibrillation with RVR. Burden 11%. HR range 64-196 with average 124 bpm  Nonsustained runs of atrial tachycardia.     Assessment / Plan: 1. Paroxysmal Afib with RVR. Now on Eliquis. [place] vasc score of 3. Afib burden low so will continue with rate control strategy. We discussed options of AAD therapy or ablation if symptoms/duration of AFib should significantly worsen.   2. PAT/ PVCs. Under fair control  on beta blocker. Avoid stimulants.   2. Hypertension, well controlled   3. Hyperlipidemia. On Zocor.

## 2019-07-13 ENCOUNTER — Telehealth: Payer: Self-pay | Admitting: Cardiology

## 2019-07-13 NOTE — Telephone Encounter (Signed)

## 2019-07-14 ENCOUNTER — Ambulatory Visit (INDEPENDENT_AMBULATORY_CARE_PROVIDER_SITE_OTHER): Payer: Medicare Other | Admitting: Cardiology

## 2019-07-14 ENCOUNTER — Other Ambulatory Visit: Payer: Self-pay

## 2019-07-14 ENCOUNTER — Encounter: Payer: Self-pay | Admitting: Cardiology

## 2019-07-14 VITALS — BP 118/72 | HR 54 | Temp 97.2°F | Ht 66.0 in | Wt 169.4 lb

## 2019-07-14 DIAGNOSIS — I471 Supraventricular tachycardia: Secondary | ICD-10-CM | POA: Diagnosis not present

## 2019-07-14 DIAGNOSIS — I4891 Unspecified atrial fibrillation: Secondary | ICD-10-CM | POA: Diagnosis not present

## 2019-07-14 DIAGNOSIS — E785 Hyperlipidemia, unspecified: Secondary | ICD-10-CM

## 2019-07-14 DIAGNOSIS — I10 Essential (primary) hypertension: Secondary | ICD-10-CM | POA: Diagnosis not present

## 2019-07-14 NOTE — Patient Instructions (Addendum)
Continue your current therapy  Follow up in 6 months   

## 2019-07-17 ENCOUNTER — Other Ambulatory Visit: Payer: Self-pay | Admitting: Nurse Practitioner

## 2019-07-17 DIAGNOSIS — I10 Essential (primary) hypertension: Secondary | ICD-10-CM

## 2019-10-15 ENCOUNTER — Other Ambulatory Visit: Payer: Self-pay | Admitting: Nurse Practitioner

## 2019-10-15 DIAGNOSIS — E7800 Pure hypercholesterolemia, unspecified: Secondary | ICD-10-CM

## 2019-10-17 NOTE — Telephone Encounter (Signed)
OV 05/25/19 no labs done

## 2019-11-20 ENCOUNTER — Other Ambulatory Visit: Payer: Self-pay | Admitting: Nurse Practitioner

## 2019-11-20 DIAGNOSIS — E7800 Pure hypercholesterolemia, unspecified: Secondary | ICD-10-CM

## 2019-11-20 DIAGNOSIS — I10 Essential (primary) hypertension: Secondary | ICD-10-CM

## 2019-12-19 ENCOUNTER — Other Ambulatory Visit: Payer: Self-pay

## 2019-12-20 ENCOUNTER — Encounter: Payer: Self-pay | Admitting: Nurse Practitioner

## 2019-12-20 ENCOUNTER — Ambulatory Visit (INDEPENDENT_AMBULATORY_CARE_PROVIDER_SITE_OTHER): Payer: Medicare Other | Admitting: Nurse Practitioner

## 2019-12-20 VITALS — BP 138/72 | HR 46 | Temp 97.8°F | Resp 20 | Ht 66.0 in | Wt 164.0 lb

## 2019-12-20 DIAGNOSIS — E7800 Pure hypercholesterolemia, unspecified: Secondary | ICD-10-CM

## 2019-12-20 DIAGNOSIS — I471 Supraventricular tachycardia: Secondary | ICD-10-CM | POA: Diagnosis not present

## 2019-12-20 DIAGNOSIS — F3342 Major depressive disorder, recurrent, in full remission: Secondary | ICD-10-CM | POA: Diagnosis not present

## 2019-12-20 DIAGNOSIS — I10 Essential (primary) hypertension: Secondary | ICD-10-CM

## 2019-12-20 DIAGNOSIS — Z6826 Body mass index (BMI) 26.0-26.9, adult: Secondary | ICD-10-CM

## 2019-12-20 MED ORDER — APIXABAN 5 MG PO TABS: 5.0000 mg | ORAL_TABLET | Freq: Two times a day (BID) | ORAL | 6 refills | Status: DC

## 2019-12-20 MED ORDER — SIMVASTATIN 40 MG PO TABS: ORAL_TABLET | ORAL | 1 refills | Status: DC

## 2019-12-20 MED ORDER — SERTRALINE HCL 50 MG PO TABS: 50.0000 mg | ORAL_TABLET | Freq: Every day | ORAL | 1 refills | Status: DC

## 2019-12-20 MED ORDER — VALSARTAN-HYDROCHLOROTHIAZIDE 160-12.5 MG PO TABS: 1.0000 | ORAL_TABLET | Freq: Every day | ORAL | 1 refills | Status: DC

## 2019-12-20 NOTE — Progress Notes (Signed)
Subjective:    Patient ID: Jean [name], female    DOB: May 30, 1951, 68 y.o.   MRN: [id number]   Chief Complaint: Medical Management of Chronic Issues    HPI:  1. Essential hypertension No c/o chest pain, sob or headache. Does not check blood pressure at home. BP Readings from Last 3 Encounters:  07/14/19 118/72  05/02/19 126/78  10/22/18 (!) 148/73     2. Pure hypercholesterolemia Tries to watch diet and is exercising when she can  3. SVT (supraventricular tachycardia) ([place]) Was started  On metoprolol anda eliquis in July. She has not had any other episodes but her heart rate is really low. She denies any dizziness or syncopial episodes. Last EKG at cardiology office showed heart rate of 50BMP.  4. Recurrent major depressive disorder, in full remission ([place]) Is currently taking zoloft. Has been on this for awhile and is doing well. Depression screen [facility] 2/9 12/20/2019 10/22/2018 05/21/2017  Decreased Interest 0 0 0  Down, Depressed, Hopeless 0 0 0  PHQ - 2 Score 0 0 0     5. BMI 26.0-26.9,adult No recent weight changes Wt Readings from Last 3 Encounters:  12/20/19 164 lb (74.4 kg)  07/14/19 169 lb 6.4 oz (76.8 kg)  05/02/19 172 lb (78 kg)   BMI Readings from Last 3 Encounters:  12/20/19 26.47 kg/m  07/14/19 27.34 kg/m  05/02/19 27.76 kg/m       Outpatient Encounter Medications as of 12/20/2019  Medication Sig  . apixaban (ELIQUIS) 5 MG TABS tablet Take 1 tablet (5 mg total) by mouth 2 (two) times daily.  Marland Kitchen Fexofenadine HCl (ALLEGRA PO) Take by mouth.  . metoprolol tartrate (LOPRESSOR) 25 MG tablet Take 1 tablet (25 mg total) by mouth 2 (two) times daily.  . sertraline (ZOLOFT) 50 MG tablet Take 1 tablet (50 mg total) by mouth daily.  . simvastatin (ZOCOR) 40 MG tablet TAKE 1 TABLET BY MOUTH EVERYDAY AT BEDTIME  . valsartan-hydrochlorothiazide (DIOVAN-HCT) 160-12.5 MG tablet TAKE 1 TABLET BY MOUTH EVERY DAY     Past Surgical History:  Procedure  Laterality Date  . US ECHOCARDIOGRAPHY     EF 60%    Family History  Problem Relation Age of Onset  . Heart disease Mother   . Hypertension Sister   . Diabetes Sister   . Anemia Paternal Grandmother     New complaints: None today  Social history: Lives with her husband. They are both retired  Controlled substance contract: n/a    Review of Systems  Constitutional: Negative for diaphoresis.  Eyes: Negative for pain.  Respiratory: Negative for shortness of breath.   Cardiovascular: Negative for chest pain, palpitations and leg swelling.  Gastrointestinal: Negative for abdominal pain.  Endocrine: Negative for polydipsia.  Skin: Negative for rash.  Neurological: Negative for dizziness, weakness and headaches.  Hematological: Does not bruise/bleed easily.  All other systems reviewed and are negative.      Objective:   Physical Exam Vitals and nursing note reviewed.  Constitutional:      General: She is not in acute distress.    Appearance: Normal appearance. She is well-developed.  HENT:     Head: Normocephalic.     Nose: Nose normal.  Eyes:     Pupils: Pupils are equal, round, and reactive to light.  Neck:     Vascular: No carotid bruit or JVD.  Cardiovascular:     Rate and Rhythm: Normal rate and regular rhythm.     Heart sounds:  Normal heart sounds.  Pulmonary:     Effort: Pulmonary effort is normal. No respiratory distress.     Breath sounds: Normal breath sounds. No wheezing or rales.  Chest:     Chest wall: No tenderness.  Abdominal:     General: Bowel sounds are normal. There is no distension or abdominal bruit.     Palpations: Abdomen is soft. There is no hepatomegaly, splenomegaly, mass or pulsatile mass.     Tenderness: There is no abdominal tenderness.  Musculoskeletal:        General: Normal range of motion.     Cervical back: Normal range of motion and neck supple.  Lymphadenopathy:     Cervical: No cervical adenopathy.  Skin:    General:  Skin is warm and dry.  Neurological:     Mental Status: She is alert and oriented to person, place, and time.     Deep Tendon Reflexes: Reflexes are normal and symmetric.  Psychiatric:        Behavior: Behavior normal.        Thought Content: Thought content normal.        Judgment: Judgment normal.    BP 138/72   Pulse (!) 46   Temp 97.8 F (36.6 C) (Temporal)   Resp 20   Ht '5\' 6"'$  (1.676 m)   Wt 164 lb (74.4 kg)   SpO2 95%   BMI 26.47 kg/m       Assessment & Plan:  TAIS KOESTNER comes in today with chief complaint of Medical Management of Chronic Issues   Diagnosis and orders addressed:  1. Essential hypertension Low sodium diet - valsartan-hydrochlorothiazide (DIOVAN-HCT) 160-12.5 MG tablet; Take 1 tablet by mouth daily.  Dispense: 90 tablet; Refill: 1 - CMP14+EGFR  2. Pure hypercholesterolemia Low fat diet - simvastatin (ZOCOR) 40 MG tablet; TAKE 1 TABLET BY MOUTH EVERYDAY AT BEDTIME  Dispense: 90 tablet; Refill: 1 - Lipid panel  3. SVT (supraventricular tachycardia) (HCC) Avoid caffeine Decrease metoprolol to 1/2 tablet BID until can see cardiology - EKG 12-Lead - apixaban (ELIQUIS) 5 MG TABS tablet; Take 1 tablet (5 mg total) by mouth 2 (two) times daily.  Dispense: 60 tablet; Refill: 6  4. Recurrent major depressive disorder, in full remission ([place]) Stress management - sertraline (ZOLOFT) 50 MG tablet; Take 1 tablet (50 mg total) by mouth daily.  Dispense: 90 tablet; Refill: 1  5. BMI 26.0-26.9,adult Discussed diet and exercise for person with BMI >25 Will recheck weight in 3-6 months    Labs pending Health Maintenance reviewed Diet and exercise encouraged  Follow up plan: 6 months   Mary-Margaret Hassell Done, FNP

## 2019-12-20 NOTE — Patient Instructions (Signed)
Exercising to Stay Healthy To become healthy and stay healthy, it is recommended that you do moderate-intensity and vigorous-intensity exercise. You can tell that you are exercising at a moderate intensity if your heart starts beating faster and you start breathing faster but can still hold a conversation. You can tell that you are exercising at a vigorous intensity if you are breathing much harder and faster and cannot hold a conversation while exercising. Exercising regularly is important. It has many health benefits, such as:  Improving overall fitness, flexibility, and endurance.  Increasing bone density.  Helping with weight control.  Decreasing body fat.  Increasing muscle strength.  Reducing stress and tension.  Improving overall health. How often should I exercise? Choose an activity that you enjoy, and set realistic goals. Your health care provider can help you make an activity plan that works for you. Exercise regularly as told by your health care provider. This may include:  Doing strength training two times a week, such as: ? Lifting weights. ? Using resistance bands. ? Push-ups. ? Sit-ups. ? Yoga.  Doing a certain intensity of exercise for a given amount of time. Choose from these options: ? A total of 150 minutes of moderate-intensity exercise every week. ? A total of 75 minutes of vigorous-intensity exercise every week. ? A mix of moderate-intensity and vigorous-intensity exercise every week. Children, pregnant women, people who have not exercised regularly, people who are overweight, and older adults may need to talk with a health care provider about what activities are safe to do. If you have a medical condition, be sure to talk with your health care provider before you start a new exercise program. What are some exercise ideas? Moderate-intensity exercise ideas include:  Walking 1 mile (1.6 km) in about 15 minutes.  Biking.  Hiking.  Golfing.  Dancing.   Water aerobics. Vigorous-intensity exercise ideas include:  Walking 4.5 miles (7.2 km) or more in about 1 hour.  Jogging or running 5 miles (8 km) in about 1 hour.  Biking 10 miles (16.1 km) or more in about 1 hour.  Lap swimming.  Roller-skating or in-line skating.  Cross-country skiing.  Vigorous competitive sports, such as football, basketball, and soccer.  Jumping rope.  Aerobic dancing. What are some everyday activities that can help me to get exercise?  Yard work, such as: ? Pushing a lawn mower. ? Raking and bagging leaves.  Washing your car.  Pushing a stroller.  Shoveling snow.  Gardening.  Washing windows or floors. How can I be more active in my day-to-day activities?  Use stairs instead of an elevator.  Take a walk during your lunch break.  If you drive, park your car farther away from your work or school.  If you take public transportation, get off one stop early and walk the rest of the way.  Stand up or walk around during all of your indoor phone calls.  Get up, stretch, and walk around every 30 minutes throughout the day.  Enjoy exercise with a friend. Support to continue exercising will help you keep a regular routine of activity. What guidelines can I follow while exercising?  Before you start a new exercise program, talk with your health care provider.  Do not exercise so much that you hurt yourself, feel dizzy, or get very short of breath.  Wear comfortable clothes and wear shoes with good support.  Drink plenty of water while you exercise to prevent dehydration or heat stroke.  Work out until your breathing   and your heartbeat get faster. Where to find more information  U.S. Department of Health and Human Services: www.hhs.gov  Centers for Disease Control and Prevention (CDC): www.cdc.gov Summary  Exercising regularly is important. It will improve your overall fitness, flexibility, and endurance.  Regular exercise also will  improve your overall health. It can help you control your weight, reduce stress, and improve your bone density.  Do not exercise so much that you hurt yourself, feel dizzy, or get very short of breath.  Before you start a new exercise program, talk with your health care provider. This information is not intended to replace advice given to you by your health care provider. Make sure you discuss any questions you have with your health care provider. Document Released: 01/10/2011 Document Revised: 11/20/2017 Document Reviewed: 10/29/2017 Elsevier Patient Education  2020 Elsevier Inc.  

## 2019-12-21 LAB — LIPID PANEL
Chol/HDL Ratio: 3.6 ratio (ref 0.0–4.4)
Cholesterol, Total: 165 mg/dL (ref 100–199)
HDL: 46 mg/dL (ref >39)
LDL Chol Calc (NIH): 100 mg/dL — ABNORMAL HIGH (ref 0–99)
Triglycerides: 106 mg/dL (ref 0–149)
VLDL Cholesterol Cal: 19 mg/dL (ref 5–40)

## 2019-12-21 LAB — CMP14+EGFR
ALT: 20 IU/L (ref 0–32)
AST: 20 IU/L (ref 0–40)
Albumin/Globulin Ratio: 1.7 (ref 1.2–2.2)
Albumin: 4.3 g/dL (ref 3.8–4.8)
Alkaline Phosphatase: 102 IU/L (ref 39–117)
BUN/Creatinine Ratio: 22 (ref 12–28)
BUN: 17 mg/dL (ref 8–27)
Bilirubin Total: 0.6 mg/dL (ref 0.0–1.2)
CO2: 28 mmol/L (ref 20–29)
Calcium: 9.5 mg/dL (ref 8.7–10.3)
Chloride: 100 mmol/L (ref 96–106)
Creatinine, Ser: 0.78 mg/dL (ref 0.57–1.00)
GFR calc Af Amer: 90 mL/min/{1.73_m2} (ref >59)
GFR calc non Af Amer: 78 mL/min/{1.73_m2} (ref >59)
Globulin, Total: 2.6 g/dL (ref 1.5–4.5)
Glucose: 92 mg/dL (ref 65–99)
Potassium: 4.2 mmol/L (ref 3.5–5.2)
Sodium: 141 mmol/L (ref 134–144)
Total Protein: 6.9 g/dL (ref 6.0–8.5)

## 2019-12-23 HISTORY — PX: BASAL CELL CARCINOMA EXCISION: SHX1214

## 2020-02-09 NOTE — Progress Notes (Deleted)
Jean [name] Date of Birth: July 04, 1951   History of Present Illness: Jean [name] is seen today for yearly followup. She has a history of SVT in the past and PVCs. Echo in 2012 showed normal LV function. Event monitor then showed predominantly PVCs with only a 4 beat run of PAT.   She was seen by Almyra Deforest PA-C for a televisit in May with complaints of increased palpitations. Zio monitor was placed. This demonstrated Afib with RVR rate ranging from 64-196. Burden 11%. Average HR 124. Nonsustained runs of atrial tachycardia. Metoprolol increased to 25 mg bid. Started on Eliquis and ASA discontinued. Echo showed normal EF with mild AI and mild LVH.   Since increasing her metoprolol and eliminating caffeine intake she is doing better with reduced palpitations. No dizziness or syncope. Notes a little more fatigue.     Current Outpatient Medications on File Prior to Visit  Medication Sig Dispense Refill  . apixaban (ELIQUIS) 5 MG TABS tablet Take 1 tablet (5 mg total) by mouth 2 (two) times daily. 60 tablet 6  . Fexofenadine HCl (ALLEGRA PO) Take by mouth.    . metoprolol tartrate (LOPRESSOR) 25 MG tablet Take 1 tablet (25 mg total) by mouth 2 (two) times daily. 180 tablet 3  . sertraline (ZOLOFT) 50 MG tablet Take 1 tablet (50 mg total) by mouth daily. 90 tablet 1  . simvastatin (ZOCOR) 40 MG tablet TAKE 1 TABLET BY MOUTH EVERYDAY AT BEDTIME 90 tablet 1  . valsartan-hydrochlorothiazide (DIOVAN-HCT) 160-12.5 MG tablet Take 1 tablet by mouth daily. 90 tablet 1   No current facility-administered medications on file prior to visit.    No Known Allergies  Past Medical History:  Diagnosis Date  . Gestational diabetes    WITH HER CHILDBIRTH  . Hyperlipidemia   . Hypertension   . Mitral insufficiency    TRIVIAL  . Palpitations   . PVC's (premature ventricular contractions) 03/16/2014  . SVT (supraventricular tachycardia) (HCC)     Past Surgical History:  Procedure Laterality Date  .  US ECHOCARDIOGRAPHY     EF 60%    Social History   Tobacco Use  Smoking Status Never Smoker  Smokeless Tobacco Never Used    Social History   Substance and Sexual Activity  Alcohol Use No    Family History  Problem Relation Age of Onset  . Heart disease Mother   . Hypertension Sister   . Diabetes Sister   . Anemia Paternal Grandmother     Review of Systems: As noted in HPI. All other systems were reviewed and are negative.  Physical Exam: There were no vitals taken for this visit. GENERAL:  Well appearing WF in NAD HEENT:  PERRL, EOMI, sclera are clear. Oropharynx is clear. NECK:  No jugular venous distention, carotid upstroke brisk and symmetric, no bruits, no thyromegaly or adenopathy LUNGS:  Clear to auscultation bilaterally CHEST:  Unremarkable HEART:  RRR,  PMI not displaced or sustained,S1 and S2 within normal limits, no S3, no S4: no clicks, no rubs, no murmurs ABD:  Soft, nontender. BS +, no masses or bruits. No hepatomegaly, no splenomegaly EXT:  2 + pulses throughout, no edema, no cyanosis no clubbing SKIN:  Warm and dry.  No rashes NEURO:  Alert and oriented x 3. Cranial nerves II through XII intact. PSYCH:  Cognitively intact    LABORATORY DATA: ECG today demonstrates sinus bradycardia with a rate of 50. beats per minute. It is otherwise normal. I have personally reviewed  and interpreted this study.   Lab Results  Component Value Date   WBC 6.1 07/08/2011   HGB 15.1 (H) 07/08/2011   HCT 44.3 07/08/2011   PLT 174.0 07/08/2011   GLUCOSE 92 12/20/2019   CHOL 165 12/20/2019   TRIG 106 12/20/2019   HDL 46 12/20/2019   LDLCALC 100 (H) 12/20/2019   ALT 20 12/20/2019   AST 20 12/20/2019   NA 141 12/20/2019   K 4.2 12/20/2019   CL 100 12/20/2019   CREATININE 0.78 12/20/2019   BUN 17 12/20/2019   CO2 28 12/20/2019   TSH 1.530 05/12/2019   HGBA1C 5.7 10/28/2018   Echo 07/04/19: IMPRESSIONS    1. The left ventricle has normal systolic function  with an ejection fraction of 60-65%. The cavity size was normal. There is mildly increased left ventricular wall thickness. Left ventricular diastolic Doppler parameters are indeterminate.  2. The right ventricle has normal systolic function. The cavity was normal. There is no increase in right ventricular wall thickness.  3. The aortic valve is tricuspid. Mild thickening of the aortic valve. Aortic valve regurgitation is mild by color flow Doppler.  Zio monitor: 06/28/19: Study Highlights   NSR  Atrial fibrillation with RVR. Burden 11%. HR range 64-196 with average 124 bpm  Nonsustained runs of atrial tachycardia.     Assessment / Plan: 1. Paroxysmal Afib with RVR. Now on Eliquis. [place] vasc score of 3. Afib burden low so will continue with rate control strategy. We discussed options of AAD therapy or ablation if symptoms/duration of AFib should significantly worsen.   2. PAT/ PVCs. Under fair control  on beta blocker. Avoid stimulants.   2. Hypertension, well controlled   3. Hyperlipidemia. On Zocor.

## 2020-02-10 ENCOUNTER — Ambulatory Visit: Payer: Medicare Other | Admitting: Cardiology

## 2020-02-17 ENCOUNTER — Encounter: Payer: Self-pay | Admitting: Physician Assistant

## 2020-02-17 ENCOUNTER — Telehealth (INDEPENDENT_AMBULATORY_CARE_PROVIDER_SITE_OTHER): Payer: Medicare PPO | Admitting: Physician Assistant

## 2020-02-17 VITALS — BP 166/84 | HR 56 | Wt 165.0 lb

## 2020-02-17 DIAGNOSIS — I10 Essential (primary) hypertension: Secondary | ICD-10-CM | POA: Diagnosis not present

## 2020-02-17 DIAGNOSIS — E785 Hyperlipidemia, unspecified: Secondary | ICD-10-CM

## 2020-02-17 DIAGNOSIS — I480 Paroxysmal atrial fibrillation: Secondary | ICD-10-CM | POA: Diagnosis not present

## 2020-02-17 NOTE — Progress Notes (Signed)
Virtual Visit via Telephone Note   This visit type was conducted due to national recommendations for restrictions regarding the COVID-19 Pandemic (e.g. social distancing) in an effort to limit this patient's exposure and mitigate transmission in our community.  Due to her co-morbid illnesses, this patient is at least at moderate risk for complications without adequate follow up.  This format is felt to be most appropriate for this patient at this time.  The patient did not have access to video technology/had technical difficulties with video requiring transitioning to audio format only (telephone).  All issues noted in this document were discussed and addressed.  No physical exam could be performed with this format.  Please refer to the patient's chart for her  consent to telehealth for [facility].   Date:  02/19/2020   ID:  Jean [name], DOB Aug 13, 1951, MRN [id number]  Patient Location: Home Provider Location: Office  PCP:  Chevis Pretty, FNP  Cardiologist:  Peter Martinique, MD  Electrophysiologist:  None   Evaluation Performed:  Follow-Up Visit  Chief Complaint:  followup  History of Present Illness:    Jean [name] is a 69 y.o. female with past medical history of hypertension, hyperlipidemia, PAF, SVT and PVCs.  Echocardiogram in 2012 showed normal LV function.  Event monitor in 2012 showed predominantly PVC with only 4 beats run of PAT. She is on a very low-dose of metoprolol tartrate to help controlling the palpitation.  Up titration of beta-blocker is hindered by borderline bradycardia.  Last time I saw the patient virtually in 2020 at which time she complained of increasing palpitation.  A 2-week ZIO monitor was placed that demonstrated atrial fibrillation with RVR with heart rate ranges from 64-196.  A. fib burden was about 11%.  Metoprolol was eventually increased to 25 mg twice daily and patient was started on Eliquis.  Previous aspirin has been discontinued.   Echocardiogram obtained on 07/04/2019 showed EF 60 to 65%, mild AR.  Last EKG was obtained by PCP on 12/20/2019, this showed sinus bradycardia with heart rate in the 40s.  Metoprolol was decreased again to 12.5 mg twice daily, but she apparently never lowered the dose as instructed.  Talking with the patient today, she is still on the 25 mg twice daily of metoprolol.  She denies any dizziness or presyncope.  Despite on the higher dose of metoprolol, she continued to have intermittent palpitation about once a week.  We discussed the possibility of decreasing metoprolol given her current asymptomatic bradycardia, she preferred to stay on the current course which I think is reasonable.  She is aware that if her palpitation become more frequent, we can consider antiarrhythmic therapy.  I think she would be a good candidate for Rythmol.  However prior to reinitiating the Rythmol therapy, she will need to cut back on the metoprolol.  Otherwise she has no exertional chest pain or shortness of breath.   The patient does not have symptoms concerning for COVID-19 infection (fever, chills, cough, or new shortness of breath).    Past Medical History:  Diagnosis Date  . Gestational diabetes    WITH HER CHILDBIRTH  . Hyperlipidemia   . Hypertension   . Mitral insufficiency    TRIVIAL  . Palpitations   . PVC's (premature ventricular contractions) 03/16/2014  . SVT (supraventricular tachycardia) (HCC)    Past Surgical History:  Procedure Laterality Date  . US ECHOCARDIOGRAPHY     EF 60%     Current Meds  Medication Sig  .  apixaban (ELIQUIS) 5 MG TABS tablet Take 1 tablet (5 mg total) by mouth 2 (two) times daily.  Marland Kitchen Fexofenadine HCl (ALLEGRA PO) Take 1 tablet by mouth daily as needed.   . sertraline (ZOLOFT) 50 MG tablet Take 1 tablet (50 mg total) by mouth daily.  . simvastatin (ZOCOR) 40 MG tablet TAKE 1 TABLET BY MOUTH EVERYDAY AT BEDTIME  . valsartan-hydrochlorothiazide (DIOVAN-HCT) 160-12.5 MG  tablet Take 1 tablet by mouth daily.     Allergies:   Patient has no known allergies.   Social History   Tobacco Use  . Smoking status: Never Smoker  . Smokeless tobacco: Never Used  Substance Use Topics  . Alcohol use: No  . Drug use: No     Family Hx: The patient's family history includes Anemia in her paternal grandmother; Diabetes in her sister; Heart disease in her mother; Hypertension in her sister.  ROS:   Please see the history of present illness.     All other systems reviewed and are negative.   Prior CV studies:   The following studies were reviewed today:  Echo 07/04/2019 IMPRESSIONS    1. The left ventricle has normal systolic function with an ejection  fraction of 60-65%. The cavity size was normal. There is mildly increased  left ventricular wall thickness. Left ventricular diastolic Doppler  parameters are indeterminate.  2. The right ventricle has normal systolic function. The cavity was  normal. There is no increase in right ventricular wall thickness.  3. The aortic valve is tricuspid. Mild thickening of the aortic valve.  Aortic valve regurgitation is mild by color flow Doppler.   Labs/Other Tests and Data Reviewed:    EKG:  An ECG dated 12/20/2019 was personally reviewed today and demonstrated:  Sinus bradycardia, heart rate 44 bpm  Recent Labs: 05/12/2019: TSH 1.530 12/20/2019: ALT 20; BUN 17; Creatinine, Ser 0.78; Potassium 4.2; Sodium 141   Recent Lipid Panel Lab Results  Component Value Date/Time   CHOL 165 12/20/2019 02:44 PM   TRIG 106 12/20/2019 02:44 PM   TRIG 100 02/28/2015 09:23 AM   HDL 46 12/20/2019 02:44 PM   HDL 41 02/28/2015 09:23 AM   CHOLHDL 3.6 12/20/2019 02:44 PM   LDLCALC 100 (H) 12/20/2019 02:44 PM   LDLCALC 62 01/05/2014 08:00 AM    Wt Readings from Last 3 Encounters:  02/17/20 165 lb (74.8 kg)  12/20/19 164 lb (74.4 kg)  07/14/19 169 lb 6.4 oz (76.8 kg)     Objective:    Vital Signs:  BP (!) 166/84    Pulse (!) 56   Wt 165 lb (74.8 kg)   BMI 26.63 kg/m    VITAL SIGNS:  reviewed  ASSESSMENT & PLAN:    1. PAF: Although she is maintaining sinus rhythm on the higher dose of metoprolol 25 mg twice daily, however her heart rate is in the 40s.  She is currently asymptomatic despite bradycardia.  We discussed potentially reducing her metoprolol again however she wished to continue on the current dose.  Since she is asymptomatic, I think this is reasonable.  Continue on Eliquis.  2. Hypertension: Blood pressure mildly elevated today, however her blood pressure was normal during the last office visit.  I am hesitant to drop her blood pressure too low especially given bradycardia.  3. Hyperlipidemia: On Zocor 40 mg daily  COVID-19 Education: The signs and symptoms of COVID-19 were discussed with the patient and how to seek care for testing (follow up with PCP or arrange  E-visit).  The importance of social distancing was discussed today.  Time:   Today, I have spent 18 minutes with the patient with telehealth technology discussing the above problems.     Medication Adjustments/Labs and Tests Ordered: Current medicines are reviewed at length with the patient today.  Concerns regarding medicines are outlined above.   Tests Ordered: No orders of the defined types were placed in this encounter.   Medication Changes: No orders of the defined types were placed in this encounter.   Follow Up:  Either In Person or Virtual in 6 month(s)  Signed, Almyra Deforest, [place]  02/19/2020 11:20 PM    [place]

## 2020-02-17 NOTE — Patient Instructions (Signed)
Medication Instructions:  Your physician recommends that you continue on your current medications as directed. Please refer to the Current Medication list given to you today.  *If you need a refill on your cardiac medications before your next appointment, please call your pharmacy*  Follow-Up: At [facility], you and your health needs are our priority.  As part of our continuing mission to provide you with exceptional heart care, we have created designated Provider Care Teams.  These Care Teams include your primary Cardiologist (physician) and Advanced Practice Providers (APPs -  Physician Assistants and Nurse Practitioners) who all work together to provide you with the care you need, when you need it.  We recommend signing up for the patient portal called "MyChart".  Sign up information is provided on this After Visit Summary.  MyChart is used to connect with patients for Virtual Visits (Telemedicine).  Patients are able to view lab/test results, encounter notes, upcoming appointments, etc.  Non-urgent messages can be sent to your provider as well.   To learn more about what you can do with MyChart, go to [email or web address].    Your next appointment:   6 month(s)  The format for your next appointment:   In Person  Provider:   Peter Martinique, MD   Other Instructions  Please call in June to schedule for August 2021

## 2020-06-21 ENCOUNTER — Ambulatory Visit: Payer: Self-pay | Admitting: Nurse Practitioner

## 2020-07-13 ENCOUNTER — Other Ambulatory Visit: Payer: Self-pay | Admitting: Nurse Practitioner

## 2020-07-13 ENCOUNTER — Other Ambulatory Visit: Payer: Self-pay

## 2020-07-13 ENCOUNTER — Encounter: Payer: Self-pay | Admitting: Nurse Practitioner

## 2020-07-13 ENCOUNTER — Ambulatory Visit: Payer: Medicare PPO | Admitting: Nurse Practitioner

## 2020-07-13 VITALS — BP 126/73 | HR 62 | Temp 98.0°F | Ht 66.0 in | Wt 168.4 lb

## 2020-07-13 DIAGNOSIS — I10 Essential (primary) hypertension: Secondary | ICD-10-CM

## 2020-07-13 DIAGNOSIS — Z6826 Body mass index (BMI) 26.0-26.9, adult: Secondary | ICD-10-CM

## 2020-07-13 DIAGNOSIS — E7800 Pure hypercholesterolemia, unspecified: Secondary | ICD-10-CM | POA: Diagnosis not present

## 2020-07-13 DIAGNOSIS — Z1231 Encounter for screening mammogram for malignant neoplasm of breast: Secondary | ICD-10-CM

## 2020-07-13 DIAGNOSIS — I471 Supraventricular tachycardia: Secondary | ICD-10-CM | POA: Diagnosis not present

## 2020-07-13 DIAGNOSIS — I493 Ventricular premature depolarization: Secondary | ICD-10-CM | POA: Diagnosis not present

## 2020-07-13 DIAGNOSIS — F3342 Major depressive disorder, recurrent, in full remission: Secondary | ICD-10-CM

## 2020-07-13 MED ORDER — SIMVASTATIN 40 MG PO TABS: ORAL_TABLET | ORAL | 1 refills | Status: DC

## 2020-07-13 MED ORDER — METOPROLOL TARTRATE 25 MG PO TABS: 25.0000 mg | ORAL_TABLET | Freq: Two times a day (BID) | ORAL | 1 refills | Status: DC

## 2020-07-13 MED ORDER — VALSARTAN-HYDROCHLOROTHIAZIDE 160-12.5 MG PO TABS: 1.0000 | ORAL_TABLET | Freq: Every day | ORAL | 1 refills | Status: DC

## 2020-07-13 MED ORDER — SERTRALINE HCL 50 MG PO TABS: 50.0000 mg | ORAL_TABLET | Freq: Every day | ORAL | 1 refills | Status: DC

## 2020-07-13 MED ORDER — APIXABAN 5 MG PO TABS: 5.0000 mg | ORAL_TABLET | Freq: Two times a day (BID) | ORAL | 6 refills | Status: DC

## 2020-07-13 NOTE — Progress Notes (Signed)
Subjective:    Patient ID: Jean [name], female    DOB: 04-Sep-1951, 69 y.o.   MRN: [id number]   Chief Complaint: Medical Management of Chronic Issues    HPI:  1. Essential hypertension Patient does not check BP at home regularly, but does own a cuff. Does try to restrict dietary sodium.  BP Readings from Last 3 Encounters:  07/13/20 126/73  02/17/20 (!) 166/84  12/20/19 138/72     2. Pure hypercholesterolemia Patient does go to the [facility] to workout regularly and also walks her dogs. Does sometimes restrict dietary fat.  Lab Results  Component Value Date   CHOL 165 12/20/2019   HDL 46 12/20/2019   LDLCALC 100 (H) 12/20/2019   TRIG 106 12/20/2019   CHOLHDL 3.6 12/20/2019     3. SVT (supraventricular tachycardia) ([place]) Patient has felt her heart race, is seeing cardiologist regularly.   4. PVC's (premature ventricular contractions) Patient takes medications as prescribed by cardiologist and sees them regularly.   5. BMI 26.0-26.9,adult Does try to restrict dietary fat, exercises regularly.  Wt Readings from Last 3 Encounters:  07/13/20 168 lb 6.4 oz (76.4 kg)  02/17/20 165 lb (74.8 kg)  12/20/19 164 lb (74.4 kg)   BMI Readings from Last 3 Encounters:  07/13/20 27.18 kg/m  02/17/20 26.63 kg/m  12/20/19 26.47 kg/m     6. Recurrent major depressive disorder, in full remission ([place]) Reports not having any depressive episodes.     Outpatient Encounter Medications as of 07/13/2020  Medication Sig  . apixaban (ELIQUIS) 5 MG TABS tablet Take 1 tablet (5 mg total) by mouth 2 (two) times daily.  Marland Kitchen Fexofenadine HCl (ALLEGRA PO) Take 1 tablet by mouth daily as needed.   . metoprolol tartrate (LOPRESSOR) 25 MG tablet Take 1 tablet (25 mg total) by mouth 2 (two) times daily.  . sertraline (ZOLOFT) 50 MG tablet Take 1 tablet (50 mg total) by mouth daily.  . simvastatin (ZOCOR) 40 MG tablet TAKE 1 TABLET BY MOUTH EVERYDAY AT BEDTIME  . valsartan-hydrochlorothiazide  (DIOVAN-HCT) 160-12.5 MG tablet Take 1 tablet by mouth daily.     Past Surgical History:  Procedure Laterality Date  . US ECHOCARDIOGRAPHY     EF 60%    Family History  Problem Relation Age of Onset  . Heart disease Mother   . Hypertension Sister   . Diabetes Sister   . Anemia Paternal Grandmother     New complaints: No new complaints.   Social history: Lives at home with her husband. Is retired.   Controlled substance contract: n/a     Review of Systems  Constitutional: Negative.   HENT: Negative.   Eyes: Negative.   Respiratory: Negative.   Cardiovascular: Negative.   Gastrointestinal: Negative.   Endocrine: Negative.   Genitourinary: Negative.   Musculoskeletal: Negative.   Skin: Negative.   Allergic/Immunologic: Negative.   Neurological: Negative.   Hematological: Negative.   Psychiatric/Behavioral: Negative.        Objective:   Physical Exam Vitals and nursing note reviewed.  Constitutional:      Appearance: Normal appearance. She is normal weight.  HENT:     Head: Normocephalic and atraumatic.     Right Ear: Tympanic membrane, ear canal and external ear normal.     Left Ear: Tympanic membrane, ear canal and external ear normal.     Nose: Nose normal.     Mouth/Throat:     Mouth: Mucous membranes are moist.     Pharynx:  Oropharynx is clear.  Eyes:     Extraocular Movements: Extraocular movements intact.     Conjunctiva/sclera: Conjunctivae normal.     Pupils: Pupils are equal, round, and reactive to light.  Cardiovascular:     Rate and Rhythm: Normal rate and regular rhythm.     Heart sounds: Murmur (3/6) heard.   Pulmonary:     Effort: Pulmonary effort is normal.     Breath sounds: Normal breath sounds.  Abdominal:     General: Abdomen is flat. Bowel sounds are normal.     Palpations: Abdomen is soft.  Genitourinary:    General: Normal vulva.     Rectum: Normal.  Musculoskeletal:        General: Normal range of motion.     Cervical  back: Normal range of motion and neck supple.  Skin:    General: Skin is warm and dry.     Capillary Refill: Capillary refill takes less than 2 seconds.  Neurological:     General: No focal deficit present.     Mental Status: She is alert and oriented to person, place, and time. Mental status is at baseline.  Psychiatric:        Mood and Affect: Mood normal.        Behavior: Behavior normal.        Thought Content: Thought content normal.        Judgment: Judgment normal.    BP 126/73   Pulse 62   Temp 98 F (36.7 C) (Temporal)   Ht '5\' 6"'$  (1.676 m)   Wt 168 lb 6.4 oz (76.4 kg)   BMI 27.18 kg/m         Assessment & Plan:  Jean [name] comes in today with chief complaint of Medical Management of Chronic Issues   Diagnosis and orders addressed:  1. Essential hypertension Patient encouraged to restrict dietary sodium and check BP at home.   2. Pure hypercholesterolemia Patient encouraged to restrict dietary fat and continue regular exercise.   3. SVT (supraventricular tachycardia) ([place]) Patient encouraged to maintain treatment regimen and continue to follow-up with cardiology.   4. PVC's (premature ventricular contractions) Patient encouraged to maintain treatment regimen and continue to follow-up with cardiology.   5. BMI 26.0-26.9,adult Patient encouraged to weigh herself regularly at home, restrict dietary fat, and exercise regularly.   6. Recurrent major depressive disorder, in full remission [facility]) Patient encouraged to maintain treatment regimen.   Meds ordered this encounter  Medications  . valsartan-hydrochlorothiazide (DIOVAN-HCT) 160-12.5 MG tablet    Sig: Take 1 tablet by mouth daily.    Dispense:  90 tablet    Refill:  1    Order Specific Question:   Supervising Provider    Answer:   Caryl Pina A [id number]  . simvastatin (ZOCOR) 40 MG tablet    Sig: TAKE 1 TABLET BY MOUTH EVERYDAY AT BEDTIME    Dispense:  90 tablet    Refill:  1     Order Specific Question:   Supervising Provider    Answer:   Caryl Pina A [id number]  . sertraline (ZOLOFT) 50 MG tablet    Sig: Take 1 tablet (50 mg total) by mouth daily.    Dispense:  90 tablet    Refill:  1    Order Specific Question:   Supervising Provider    Answer:   Caryl Pina A [id number]  . apixaban (ELIQUIS) 5 MG TABS tablet    Sig: Take 1 tablet (5 mg  total) by mouth 2 (two) times daily.    Dispense:  60 tablet    Refill:  6    Order Specific Question:   Supervising Provider    Answer:   Caryl Pina A [id number]  . metoprolol tartrate (LOPRESSOR) 25 MG tablet    Sig: Take 1 tablet (25 mg total) by mouth 2 (two) times daily.    Dispense:  180 tablet    Refill:  1    Order Specific Question:   Supervising Provider    Answer:   Caryl Pina A [[id number]]   Orders Placed This Encounter  Procedures  . CBC with Differential/Platelet  . CMP14+EGFR  . Lipid panel    Labs pending Health Maintenance reviewed- schedule mammogram Diet and exercise encouraged  Follow up plan: Follow-up in one year.    Mary-Margaret Hassell Done, FNP Alean Rinne, FNP Student

## 2020-07-13 NOTE — Patient Instructions (Signed)
Exercising to Stay Healthy To become healthy and stay healthy, it is recommended that you do moderate-intensity and vigorous-intensity exercise. You can tell that you are exercising at a moderate intensity if your heart starts beating faster and you start breathing faster but can still hold a conversation. You can tell that you are exercising at a vigorous intensity if you are breathing much harder and faster and cannot hold a conversation while exercising. Exercising regularly is important. It has many health benefits, such as:  Improving overall fitness, flexibility, and endurance.  Increasing bone density.  Helping with weight control.  Decreasing body fat.  Increasing muscle strength.  Reducing stress and tension.  Improving overall health. How often should I exercise? Choose an activity that you enjoy, and set realistic goals. Your health care provider can help you make an activity plan that works for you. Exercise regularly as told by your health care provider. This may include:  Doing strength training two times a week, such as: ? Lifting weights. ? Using resistance bands. ? Push-ups. ? Sit-ups. ? Yoga.  Doing a certain intensity of exercise for a given amount of time. Choose from these options: ? A total of 150 minutes of moderate-intensity exercise every week. ? A total of 75 minutes of vigorous-intensity exercise every week. ? A mix of moderate-intensity and vigorous-intensity exercise every week. Children, pregnant women, people who have not exercised regularly, people who are overweight, and older adults may need to talk with a health care provider about what activities are safe to do. If you have a medical condition, be sure to talk with your health care provider before you start a new exercise program. What are some exercise ideas? Moderate-intensity exercise ideas include:  Walking 1 mile (1.6 km) in about 15  minutes.  Biking.  Hiking.  Golfing.  Dancing.  Water aerobics. Vigorous-intensity exercise ideas include:  Walking 4.5 miles (7.2 km) or more in about 1 hour.  Jogging or running 5 miles (8 km) in about 1 hour.  Biking 10 miles (16.1 km) or more in about 1 hour.  Lap swimming.  Roller-skating or in-line skating.  Cross-country skiing.  Vigorous competitive sports, such as football, basketball, and soccer.  Jumping rope.  Aerobic dancing. What are some everyday activities that can help me to get exercise?  Yard work, such as: ? Pushing a lawn mower. ? Raking and bagging leaves.  Washing your car.  Pushing a stroller.  Shoveling snow.  Gardening.  Washing windows or floors. How can I be more active in my day-to-day activities?  Use stairs instead of an elevator.  Take a walk during your lunch break.  If you drive, park your car farther away from your work or school.  If you take public transportation, get off one stop early and walk the rest of the way.  Stand up or walk around during all of your indoor phone calls.  Get up, stretch, and walk around every 30 minutes throughout the day.  Enjoy exercise with a friend. Support to continue exercising will help you keep a regular routine of activity. What guidelines can I follow while exercising?  Before you start a new exercise program, talk with your health care provider.  Do not exercise so much that you hurt yourself, feel dizzy, or get very short of breath.  Wear comfortable clothes and wear shoes with good support.  Drink plenty of water while you exercise to prevent dehydration or heat stroke.  Work out until your breathing   and your heartbeat get faster. Where to find more information  [place] Department of Health and Human Services: www.hhs.gov  Centers for Disease Control and Prevention (CDC): www.cdc.gov Summary  Exercising regularly is important. It will improve your overall fitness,  flexibility, and endurance.  Regular exercise also will improve your overall health. It can help you control your weight, reduce stress, and improve your bone density.  Do not exercise so much that you hurt yourself, feel dizzy, or get very short of breath.  Before you start a new exercise program, talk with your health care provider. This information is not intended to replace advice given to you by your health care provider. Make sure you discuss any questions you have with your health care provider. Document Revised: 11/20/2017 Document Reviewed: 10/29/2017 Elsevier Patient Education  2020 Elsevier Inc.  

## 2020-07-14 LAB — CBC WITH DIFFERENTIAL/PLATELET
Basophils Absolute: 0 10*3/uL (ref 0.0–0.2)
Basos: 0 %
EOS (ABSOLUTE): 0.1 10*3/uL (ref 0.0–0.4)
Eos: 2 %
Hematocrit: 45.2 % (ref 34.0–46.6)
Hemoglobin: 15.2 g/dL (ref 11.1–15.9)
Immature Grans (Abs): 0 10*3/uL (ref 0.0–0.1)
Immature Granulocytes: 0 %
Lymphocytes Absolute: 0.5 10*3/uL — ABNORMAL LOW (ref 0.7–3.1)
Lymphs: 9 %
MCH: 30.1 pg (ref 26.6–33.0)
MCHC: 33.6 g/dL (ref 31.5–35.7)
MCV: 90 fL (ref 79–97)
Monocytes Absolute: 0.6 10*3/uL (ref 0.1–0.9)
Monocytes: 10 %
Neutrophils Absolute: 4.6 10*3/uL (ref 1.4–7.0)
Neutrophils: 79 %
Platelets: 109 10*3/uL — ABNORMAL LOW (ref 150–450)
RBC: 5.05 x10E6/uL (ref 3.77–5.28)
RDW: 12.5 % (ref 11.7–15.4)
WBC: 5.9 10*3/uL (ref 3.4–10.8)

## 2020-07-14 LAB — CMP14+EGFR
ALT: 22 IU/L (ref 0–32)
AST: 25 IU/L (ref 0–40)
Albumin/Globulin Ratio: 1.6 (ref 1.2–2.2)
Albumin: 4.3 g/dL (ref 3.8–4.8)
Alkaline Phosphatase: 96 IU/L (ref 48–121)
BUN/Creatinine Ratio: 23 (ref 12–28)
BUN: 17 mg/dL (ref 8–27)
Bilirubin Total: 0.8 mg/dL (ref 0.0–1.2)
CO2: 25 mmol/L (ref 20–29)
Calcium: 9.2 mg/dL (ref 8.7–10.3)
Chloride: 102 mmol/L (ref 96–106)
Creatinine, Ser: 0.75 mg/dL (ref 0.57–1.00)
GFR calc Af Amer: 93 mL/min/{1.73_m2} (ref >59)
GFR calc non Af Amer: 81 mL/min/{1.73_m2} (ref >59)
Globulin, Total: 2.7 g/dL (ref 1.5–4.5)
Glucose: 119 mg/dL — ABNORMAL HIGH (ref 65–99)
Potassium: 4.1 mmol/L (ref 3.5–5.2)
Sodium: 139 mmol/L (ref 134–144)
Total Protein: 7 g/dL (ref 6.0–8.5)

## 2020-07-14 LAB — LIPID PANEL
Chol/HDL Ratio: 3.3 ratio (ref 0.0–4.4)
Cholesterol, Total: 163 mg/dL (ref 100–199)
HDL: 49 mg/dL (ref >39)
LDL Chol Calc (NIH): 92 mg/dL (ref 0–99)
Triglycerides: 125 mg/dL (ref 0–149)
VLDL Cholesterol Cal: 22 mg/dL (ref 5–40)

## 2020-08-28 DIAGNOSIS — C4441 Basal cell carcinoma of skin of scalp and neck: Secondary | ICD-10-CM | POA: Diagnosis not present

## 2020-08-28 DIAGNOSIS — L814 Other melanin hyperpigmentation: Secondary | ICD-10-CM | POA: Diagnosis not present

## 2020-08-28 DIAGNOSIS — L579 Skin changes due to chronic exposure to nonionizing radiation, unspecified: Secondary | ICD-10-CM | POA: Diagnosis not present

## 2020-08-28 DIAGNOSIS — L820 Inflamed seborrheic keratosis: Secondary | ICD-10-CM | POA: Diagnosis not present

## 2020-08-28 DIAGNOSIS — L821 Other seborrheic keratosis: Secondary | ICD-10-CM | POA: Diagnosis not present

## 2020-08-28 DIAGNOSIS — D485 Neoplasm of uncertain behavior of skin: Secondary | ICD-10-CM | POA: Diagnosis not present

## 2020-09-11 NOTE — Progress Notes (Signed)
Jean [name] Date of Birth: 05/04/1951   History of Present Illness: Jean [name] is seen today for yearly followup. She has a history of SVT in the past and PVCs. Echo in 2012 showed normal LV function. Event monitor then showed predominantly PVCs with only a 4 beat run of PAT.   She was seen by Almyra Deforest PA-C for a televisit in May with complaints of increased palpitations. Zio monitor was placed. This demonstrated Afib with RVR rate ranging from 64-196. Burden 11%. Average HR 124. Nonsustained runs of atrial tachycardia. Metoprolol increased to 25 mg bid. Started on Eliquis and ASA discontinued. Echo showed normal EF with mild AI and mild LVH.   On follow up today she states she still has irregular rhythms but not as fast. She does note dry eyes. She has hair loss that started on metoprolol.     Current Outpatient Medications on File Prior to Visit  Medication Sig Dispense Refill  . apixaban (ELIQUIS) 5 MG TABS tablet Take 1 tablet (5 mg total) by mouth 2 (two) times daily. 60 tablet 6  . Fexofenadine HCl (ALLEGRA PO) Take 1 tablet by mouth daily as needed.     . sertraline (ZOLOFT) 50 MG tablet Take 1 tablet (50 mg total) by mouth daily. 90 tablet 1  . valsartan-hydrochlorothiazide (DIOVAN-HCT) 160-12.5 MG tablet Take 1 tablet by mouth daily. 90 tablet 1   No current facility-administered medications on file prior to visit.    No Known Allergies  Past Medical History:  Diagnosis Date  . Gestational diabetes    WITH HER CHILDBIRTH  . Hyperlipidemia   . Hypertension   . Mitral insufficiency    TRIVIAL  . Palpitations   . PVC's (premature ventricular contractions) 03/16/2014  . SVT (supraventricular tachycardia) (HCC)     Past Surgical History:  Procedure Laterality Date  . US ECHOCARDIOGRAPHY     EF 60%    Social History   Tobacco Use  Smoking Status Never Smoker  Smokeless Tobacco Never Used    Social History   Substance and Sexual Activity  Alcohol Use No     Family History  Problem Relation Age of Onset  . Heart disease Mother   . Hypertension Sister   . Diabetes Sister   . Anemia Paternal Grandmother     Review of Systems: As noted in HPI. All other systems were reviewed and are negative.  Physical Exam: BP (!) 148/76   Pulse 71   Ht 5\' 6"  (1.676 m)   Wt 166 lb 3.2 oz (75.4 kg)   SpO2 97%   BMI 26.83 kg/m  GENERAL:  Well appearing WF in NAD HEENT:  PERRL, EOMI, sclera are clear. Oropharynx is clear. NECK:  No jugular venous distention, carotid upstroke brisk and symmetric, no bruits, no thyromegaly or adenopathy LUNGS:  Clear to auscultation bilaterally CHEST:  Unremarkable HEART:  RRR,  PMI not displaced or sustained,S1 and S2 within normal limits, no S3, no S4: no clicks, no rubs, no murmurs ABD:  Soft, nontender. BS +, no masses or bruits. No hepatomegaly, no splenomegaly EXT:  2 + pulses throughout, no edema, no cyanosis no clubbing SKIN:  Warm and dry.  No rashes NEURO:  Alert and oriented x 3. Cranial nerves II through XII intact. PSYCH:  Cognitively intact    LABORATORY DATA:  Lab Results  Component Value Date   WBC 5.9 07/13/2020   HGB 15.2 07/13/2020   HCT 45.2 07/13/2020   PLT 109 (L)  07/13/2020   GLUCOSE 119 (H) 07/13/2020   CHOL 163 07/13/2020   TRIG 125 07/13/2020   HDL 49 07/13/2020   LDLCALC 92 07/13/2020   ALT 22 07/13/2020   AST 25 07/13/2020   NA 139 07/13/2020   K 4.1 07/13/2020   CL 102 07/13/2020   CREATININE 0.75 07/13/2020   BUN 17 07/13/2020   CO2 25 07/13/2020   TSH 1.530 05/12/2019   HGBA1C 5.7 10/28/2018   Echo 07/04/19: IMPRESSIONS    1. The left ventricle has normal systolic function with an ejection fraction of 60-65%. The cavity size was normal. There is mildly increased left ventricular wall thickness. Left ventricular diastolic Doppler parameters are indeterminate.  2. The right ventricle has normal systolic function. The cavity was normal. There is no increase in right  ventricular wall thickness.  3. The aortic valve is tricuspid. Mild thickening of the aortic valve. Aortic valve regurgitation is mild by color flow Doppler.  Zio monitor: 06/28/19: Study Highlights   NSR  Atrial fibrillation with RVR. Burden 11%. HR range 64-196 with average 124 bpm  Nonsustained runs of atrial tachycardia.     Assessment / Plan: 1. Paroxysmal Afib with RVR. Now on Eliquis. [place] vasc score of 3. Afib burden low so will continue with rate control strategy. We will switch metoprolol to diltiazem ER 180 mg daily due to hair loss. We discussed options of AAD therapy or ablation if symptoms/duration of AFib should significantly worsen.   2. PAT/ PVCs. Under fair control  on beta blocker. Avoid stimulants.   2. Hypertension, well controlled   3. Hyperlipidemia. On Zocor. Given interaction with diltiazem will switch to Crestor 20 mg daily.   Follow up in 6 months.

## 2020-09-14 ENCOUNTER — Ambulatory Visit: Payer: Medicare PPO | Admitting: Cardiology

## 2020-09-14 ENCOUNTER — Other Ambulatory Visit: Payer: Self-pay

## 2020-09-14 ENCOUNTER — Encounter: Payer: Self-pay | Admitting: Cardiology

## 2020-09-14 VITALS — BP 148/76 | HR 71 | Ht 66.0 in | Wt 166.2 lb

## 2020-09-14 DIAGNOSIS — I480 Paroxysmal atrial fibrillation: Secondary | ICD-10-CM

## 2020-09-14 DIAGNOSIS — I10 Essential (primary) hypertension: Secondary | ICD-10-CM

## 2020-09-14 DIAGNOSIS — E785 Hyperlipidemia, unspecified: Secondary | ICD-10-CM | POA: Diagnosis not present

## 2020-09-14 DIAGNOSIS — I471 Supraventricular tachycardia: Secondary | ICD-10-CM | POA: Diagnosis not present

## 2020-09-14 MED ORDER — DILTIAZEM HCL ER COATED BEADS 180 MG PO CP24
180.0000 mg | ORAL_CAPSULE | Freq: Every day | ORAL | 3 refills | Status: DC
Start: 2020-09-14 — End: 2021-01-15

## 2020-09-14 MED ORDER — ROSUVASTATIN CALCIUM 20 MG PO TABS
20.0000 mg | ORAL_TABLET | Freq: Every day | ORAL | 3 refills | Status: DC
Start: 2020-09-14 — End: 2021-01-15

## 2020-09-14 NOTE — Patient Instructions (Signed)
Stop taking metoprolol  Start Diltiazem ER 180 mg daily  Switch Zocor to Crestor 20 mg daily

## 2020-09-19 ENCOUNTER — Other Ambulatory Visit: Payer: Self-pay

## 2020-09-19 ENCOUNTER — Ambulatory Visit
Admission: RE | Admit: 2020-09-19 | Discharge: 2020-09-19 | Disposition: A | Discharge status: Home / Self Care | Payer: Medicare PPO | Source: Ambulatory Visit | Attending: Nurse Practitioner | Admitting: Nurse Practitioner

## 2020-09-19 DIAGNOSIS — Z1231 Encounter for screening mammogram for malignant neoplasm of breast: Secondary | ICD-10-CM

## 2020-09-21 ENCOUNTER — Other Ambulatory Visit: Payer: Self-pay | Admitting: Nurse Practitioner

## 2020-09-21 DIAGNOSIS — R928 Other abnormal and inconclusive findings on diagnostic imaging of breast: Secondary | ICD-10-CM

## 2020-10-08 ENCOUNTER — Other Ambulatory Visit: Payer: Medicare PPO

## 2020-10-15 DIAGNOSIS — C4441 Basal cell carcinoma of skin of scalp and neck: Secondary | ICD-10-CM | POA: Insufficient documentation

## 2020-10-16 NOTE — Progress Notes (Signed)
Patient has appt on 10/29/2020 for additional imaging at the [facility]

## 2020-10-29 ENCOUNTER — Other Ambulatory Visit: Payer: Self-pay

## 2020-10-29 ENCOUNTER — Ambulatory Visit
Admission: RE | Admit: 2020-10-29 | Discharge: 2020-10-29 | Disposition: A | Discharge status: Home / Self Care | Payer: Medicare PPO | Source: Ambulatory Visit | Attending: Nurse Practitioner | Admitting: Nurse Practitioner

## 2020-10-29 DIAGNOSIS — R928 Other abnormal and inconclusive findings on diagnostic imaging of breast: Secondary | ICD-10-CM

## 2020-10-29 DIAGNOSIS — N6001 Solitary cyst of right breast: Secondary | ICD-10-CM | POA: Diagnosis not present

## 2020-12-04 ENCOUNTER — Other Ambulatory Visit: Payer: Self-pay | Admitting: Nurse Practitioner

## 2020-12-04 DIAGNOSIS — R928 Other abnormal and inconclusive findings on diagnostic imaging of breast: Secondary | ICD-10-CM

## 2021-01-15 ENCOUNTER — Other Ambulatory Visit: Payer: Self-pay

## 2021-01-15 ENCOUNTER — Encounter: Payer: Self-pay | Admitting: Nurse Practitioner

## 2021-01-15 ENCOUNTER — Ambulatory Visit: Payer: Medicare PPO | Admitting: Nurse Practitioner

## 2021-01-15 VITALS — BP 121/78 | HR 54 | Temp 97.5°F | Resp 20 | Ht 66.0 in | Wt 160.0 lb

## 2021-01-15 DIAGNOSIS — F3342 Major depressive disorder, recurrent, in full remission: Secondary | ICD-10-CM

## 2021-01-15 DIAGNOSIS — Z6825 Body mass index (BMI) 25.0-25.9, adult: Secondary | ICD-10-CM

## 2021-01-15 DIAGNOSIS — I471 Supraventricular tachycardia: Secondary | ICD-10-CM

## 2021-01-15 DIAGNOSIS — E7800 Pure hypercholesterolemia, unspecified: Secondary | ICD-10-CM | POA: Diagnosis not present

## 2021-01-15 DIAGNOSIS — Z23 Encounter for immunization: Secondary | ICD-10-CM

## 2021-01-15 DIAGNOSIS — I10 Essential (primary) hypertension: Secondary | ICD-10-CM | POA: Diagnosis not present

## 2021-01-15 MED ORDER — ROSUVASTATIN CALCIUM 20 MG PO TABS
20.0000 mg | ORAL_TABLET | Freq: Every day | ORAL | 1 refills | Status: DC
Start: 2021-01-15 — End: 2021-08-12

## 2021-01-15 MED ORDER — SERTRALINE HCL 50 MG PO TABS
50.0000 mg | ORAL_TABLET | Freq: Every day | ORAL | 1 refills | Status: DC
Start: 2021-01-15 — End: 2021-07-29

## 2021-01-15 MED ORDER — VALSARTAN-HYDROCHLOROTHIAZIDE 160-12.5 MG PO TABS: 1.0000 | ORAL_TABLET | Freq: Every day | ORAL | 1 refills | Status: DC

## 2021-01-15 MED ORDER — DILTIAZEM HCL ER COATED BEADS 180 MG PO CP24: 180.0000 mg | ORAL_CAPSULE | Freq: Every day | ORAL | 1 refills | Status: DC

## 2021-01-15 MED ORDER — APIXABAN 5 MG PO TABS
5.0000 mg | ORAL_TABLET | Freq: Two times a day (BID) | ORAL | 6 refills | Status: DC
Start: 2021-01-15 — End: 2021-08-12

## 2021-01-15 NOTE — Progress Notes (Signed)
Subjective:    Patient ID: Jean [name], female    DOB: 08/06/1951, 70 y.o.   MRN: [id number]   Chief Complaint: medical management of chronic issues     HPI:  1. Primary hypertension No c/o chest pain, sob or headache. doesnot check blood pressure at home. BP Readings from Last 3 Encounters:  01/15/21 121/78  09/14/20 (!) 148/76  07/13/20 126/73      2. Pure hypercholesterolemia Does try to watch diet. She does try to walk daily Lab Results  Component Value Date   CHOL 163 07/13/2020   HDL 49 07/13/2020   LDLCALC 92 07/13/2020   TRIG 125 07/13/2020   CHOLHDL 3.3 07/13/2020     3. SVT (supraventricular tachycardia) ([facility]) Has  palpitations or heart racing from tim eto time. Cardiology is aware. Is on eliquis without any side effects.  4. Recurrent major depressive disorder, in full remission ([place]) Is on zoloft and that seems to be working well for her. Denies nay side effects. Depression screen [facility] 2/9 01/15/2021 07/13/2020 12/20/2019  Decreased Interest 0 0 0  Down, Depressed, Hopeless 0 - 0  PHQ - 2 Score 0 0 0  Altered sleeping 0 - -  Tired, decreased energy 0 - -  Change in appetite 0 - -  Feeling bad or failure about yourself  0 - -  Trouble concentrating 0 - -  Moving slowly or fidgety/restless 0 - -  Suicidal thoughts 0 - -  PHQ-9 Score 0 - -  Difficult doing work/chores Not difficult at all - -     5. BMI 26.0-26.9,adult weight is down 6lbs Wt Readings from Last 3 Encounters:  01/15/21 160 lb (72.6 kg)  09/14/20 166 lb 3.2 oz (75.4 kg)  07/13/20 168 lb 6.4 oz (76.4 kg)    BMI Readings from Last 3 Encounters:  01/15/21 25.82 kg/m  09/14/20 26.83 kg/m  07/13/20 27.18 kg/m      Outpatient Encounter Medications as of 01/15/2021  Medication Sig  . apixaban (ELIQUIS) 5 MG TABS tablet Take 1 tablet (5 mg total) by mouth 2 (two) times daily.  Marland Kitchen diltiazem (CARDIZEM CD) 180 MG 24 hr capsule Take 1 capsule (180 mg total) by mouth daily.  Marland Kitchen  Fexofenadine HCl (ALLEGRA PO) Take 1 tablet by mouth daily as needed.   . rosuvastatin (CRESTOR) 20 MG tablet Take 1 tablet (20 mg total) by mouth daily.  . sertraline (ZOLOFT) 50 MG tablet Take 1 tablet (50 mg total) by mouth daily.  . valsartan-hydrochlorothiazide (DIOVAN-HCT) 160-12.5 MG tablet Take 1 tablet by mouth daily.     Past Surgical History:  Procedure Laterality Date  . US ECHOCARDIOGRAPHY     EF 60%    Family History  Problem Relation Age of Onset  . Heart disease Mother   . Hypertension Sister   . Diabetes Sister   . 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  Controlled substance contract: n/a    Review of Systems  Constitutional: Negative for diaphoresis.  Eyes: Negative for pain.  Respiratory: Negative for shortness of breath.   Cardiovascular: Negative for chest pain, palpitations and leg swelling.  Gastrointestinal: Negative for abdominal pain.  Endocrine: Negative for polydipsia.  Skin: Negative for rash.  Neurological: Negative for dizziness, weakness and headaches.  Hematological: Does not bruise/bleed easily.  All other systems reviewed and are negative.      Objective:  Physical Exam Vitals and nursing note reviewed.  Constitutional:      General: She is not in acute distress.    Appearance: Normal appearance. She is well-developed and well-nourished.  HENT:     Head: Normocephalic.     Nose: Nose normal.     Mouth/Throat:     Mouth: Oropharynx is clear and moist.  Eyes:     Extraocular Movements: EOM normal.     Pupils: Pupils are equal, round, and reactive to light.  Neck:     Vascular: No carotid bruit or JVD.  Cardiovascular:     Rate and Rhythm: Normal rate and regular rhythm.     Pulses: Intact distal pulses.     Heart sounds: Normal heart sounds.  Pulmonary:     Effort: Pulmonary effort is normal. No respiratory distress.      Breath sounds: Normal breath sounds. No wheezing or rales.  Chest:     Chest wall: No tenderness.  Abdominal:     General: Bowel sounds are normal. There is no distension or abdominal bruit. Aorta is normal.     Palpations: Abdomen is soft. There is no hepatomegaly, splenomegaly, mass or pulsatile mass.     Tenderness: There is no abdominal tenderness.  Musculoskeletal:        General: No edema. Normal range of motion.     Cervical back: Normal range of motion and neck supple.  Lymphadenopathy:     Cervical: No cervical adenopathy.  Skin:    General: Skin is warm and dry.  Neurological:     Mental Status: She is alert and oriented to person, place, and time.     Deep Tendon Reflexes: Reflexes are normal and symmetric.  Psychiatric:        Mood and Affect: Mood and affect normal.        Behavior: Behavior normal.        Thought Content: Thought content normal.        Judgment: Judgment normal.     BP 121/78   Pulse (!) 54   Temp (!) 97.5 F (36.4 C) (Temporal)   Resp 20   Ht $R'5\' 6"'zU$  (1.676 m)   Wt 160 lb (72.6 kg)   SpO2 97%   BMI 25.82 kg/m        Assessment & Plan:  Jean [name] comes in today with chief complaint of Medical Management of Chronic Issues   Diagnosis and orders addressed:  1. Primary hypertension Low sodium diet - CBC with Differential/Platelet - CMP14+EGFR - valsartan-hydrochlorothiazide (DIOVAN-HCT) 160-12.5 MG tablet; Take 1 tablet by mouth daily.  Dispense: 90 tablet; Refill: 1  2. Pure hypercholesterolemia Low fat det - Lipid panel - C-reactive protein - rosuvastatin (CRESTOR) 20 MG tablet; Take 1 tablet (20 mg total) by mouth daily.  Dispense: 90 tablet; Refill: 1  3. SVT (supraventricular tachycardia) (HCC) Avoid caffeine - apixaban (ELIQUIS) 5 MG TABS tablet; Take 1 tablet (5 mg total) by mouth 2 (two) times daily.  Dispense: 60 tablet; Refill: 6 - diltiazem (CARDIZEM CD) 180 MG 24 hr capsule; Take 1 capsule (180 mg total) by mouth  daily.  Dispense: 90 capsule; Refill: 1  4. Recurrent major depressive disorder, in full remission ([place]) Stress management - sertraline (ZOLOFT) 50 MG tablet; Take 1 tablet (50 mg total) by mouth daily.  Dispense: 90 tablet; Refill: 1  5. BMI 25.0-25.9,adult Discussed diet and exercise for person with BMI >25 Will recheck weight in 3-6 months    Labs  pending Health Maintenance reviewed Diet and exercise encouraged  Follow up plan: 6 months   Mary-Margaret Hassell Done, FNP

## 2021-01-15 NOTE — Addendum Note (Signed)
Addended by: Rolena Infante on: 01/15/2021 11:01 AM   Modules accepted: Orders

## 2021-01-15 NOTE — Patient Instructions (Signed)
Exercising to Stay Healthy To become healthy and stay healthy, it is recommended that you do moderate-intensity and vigorous-intensity exercise. You can tell that you are exercising at a moderate intensity if your heart starts beating faster and you start breathing faster but can still hold a conversation. You can tell that you are exercising at a vigorous intensity if you are breathing much harder and faster and cannot hold a conversation while exercising. Exercising regularly is important. It has many health benefits, such as:  Improving overall fitness, flexibility, and endurance.  Increasing bone density.  Helping with weight control.  Decreasing body fat.  Increasing muscle strength.  Reducing stress and tension.  Improving overall health. How often should I exercise? Choose an activity that you enjoy, and set realistic goals. Your health care provider can help you make an activity plan that works for you. Exercise regularly as told by your health care provider. This may include:  Doing strength training two times a week, such as: ? Lifting weights. ? Using resistance bands. ? Push-ups. ? Sit-ups. ? Yoga.  Doing a certain intensity of exercise for a given amount of time. Choose from these options: ? A total of 150 minutes of moderate-intensity exercise every week. ? A total of 75 minutes of vigorous-intensity exercise every week. ? A mix of moderate-intensity and vigorous-intensity exercise every week. Children, pregnant women, people who have not exercised regularly, people who are overweight, and older adults may need to talk with a health care provider about what activities are safe to do. If you have a medical condition, be sure to talk with your health care provider before you start a new exercise program. What are some exercise ideas? Moderate-intensity exercise ideas include:  Walking 1 mile (1.6 km) in about 15  minutes.  Biking.  Hiking.  Golfing.  Dancing.  Water aerobics. Vigorous-intensity exercise ideas include:  Walking 4.5 miles (7.2 km) or more in about 1 hour.  Jogging or running 5 miles (8 km) in about 1 hour.  Biking 10 miles (16.1 km) or more in about 1 hour.  Lap swimming.  Roller-skating or in-line skating.  Cross-country skiing.  Vigorous competitive sports, such as football, basketball, and soccer.  Jumping rope.  Aerobic dancing.   What are some everyday activities that can help me to get exercise?  Yard work, such as: ? Pushing a lawn mower. ? Raking and bagging leaves.  Washing your car.  Pushing a stroller.  Shoveling snow.  Gardening.  Washing windows or floors. How can I be more active in my day-to-day activities?  Use stairs instead of an elevator.  Take a walk during your lunch break.  If you drive, park your car farther away from your work or school.  If you take public transportation, get off one stop early and walk the rest of the way.  Stand up or walk around during all of your indoor phone calls.  Get up, stretch, and walk around every 30 minutes throughout the day.  Enjoy exercise with a friend. Support to continue exercising will help you keep a regular routine of activity. What guidelines can I follow while exercising?  Before you start a new exercise program, talk with your health care provider.  Do not exercise so much that you hurt yourself, feel dizzy, or get very short of breath.  Wear comfortable clothes and wear shoes with good support.  Drink plenty of water while you exercise to prevent dehydration or heat stroke.  Work out until   your breathing and your heartbeat get faster. Where to find more information  [place] Department of Health and Human Services: www.hhs.gov  Centers for Disease Control and Prevention (CDC): www.cdc.gov Summary  Exercising regularly is important. It will improve your overall fitness,  flexibility, and endurance.  Regular exercise also will improve your overall health. It can help you control your weight, reduce stress, and improve your bone density.  Do not exercise so much that you hurt yourself, feel dizzy, or get very short of breath.  Before you start a new exercise program, talk with your health care provider. This information is not intended to replace advice given to you by your health care provider. Make sure you discuss any questions you have with your health care provider. Document Revised: 11/20/2017 Document Reviewed: 10/29/2017 Elsevier Patient Education  2021 Elsevier Inc.  

## 2021-01-16 LAB — CMP14+EGFR
ALT: 18 IU/L (ref 0–32)
AST: 19 IU/L (ref 0–40)
Albumin/Globulin Ratio: 1.4 (ref 1.2–2.2)
Albumin: 4.3 g/dL (ref 3.8–4.8)
Alkaline Phosphatase: 105 IU/L (ref 44–121)
BUN/Creatinine Ratio: 23 (ref 12–28)
BUN: 21 mg/dL (ref 8–27)
Bilirubin Total: 0.7 mg/dL (ref 0.0–1.2)
CO2: 23 mmol/L (ref 20–29)
Calcium: 9.7 mg/dL (ref 8.7–10.3)
Chloride: 102 mmol/L (ref 96–106)
Creatinine, Ser: 0.92 mg/dL (ref 0.57–1.00)
GFR calc Af Amer: 73 mL/min/{1.73_m2} (ref >59)
GFR calc non Af Amer: 63 mL/min/{1.73_m2} (ref >59)
Globulin, Total: 3.1 g/dL (ref 1.5–4.5)
Glucose: 100 mg/dL — ABNORMAL HIGH (ref 65–99)
Potassium: 4.1 mmol/L (ref 3.5–5.2)
Sodium: 142 mmol/L (ref 134–144)
Total Protein: 7.4 g/dL (ref 6.0–8.5)

## 2021-01-16 LAB — LIPID PANEL
Chol/HDL Ratio: 3.4 ratio (ref 0.0–4.4)
Cholesterol, Total: 148 mg/dL (ref 100–199)
HDL: 44 mg/dL (ref >39)
LDL Chol Calc (NIH): 84 mg/dL (ref 0–99)
Triglycerides: 111 mg/dL (ref 0–149)
VLDL Cholesterol Cal: 20 mg/dL (ref 5–40)

## 2021-01-16 LAB — CBC WITH DIFFERENTIAL/PLATELET
Basophils Absolute: 0 10*3/uL (ref 0.0–0.2)
Basos: 1 %
EOS (ABSOLUTE): 0.1 10*3/uL (ref 0.0–0.4)
Eos: 1 %
Hematocrit: 44.8 % (ref 34.0–46.6)
Hemoglobin: 15.7 g/dL (ref 11.1–15.9)
Immature Grans (Abs): 0 10*3/uL (ref 0.0–0.1)
Immature Granulocytes: 0 %
Lymphocytes Absolute: 0.9 10*3/uL (ref 0.7–3.1)
Lymphs: 15 %
MCH: 30.7 pg (ref 26.6–33.0)
MCHC: 35 g/dL (ref 31.5–35.7)
MCV: 88 fL (ref 79–97)
Monocytes Absolute: 0.7 10*3/uL (ref 0.1–0.9)
Monocytes: 11 %
Neutrophils Absolute: 4.2 10*3/uL (ref 1.4–7.0)
Neutrophils: 72 %
Platelets: 138 10*3/uL — ABNORMAL LOW (ref 150–450)
RBC: 5.12 x10E6/uL (ref 3.77–5.28)
RDW: 12 % (ref 11.7–15.4)
WBC: 5.8 10*3/uL (ref 3.4–10.8)

## 2021-01-16 LAB — C-REACTIVE PROTEIN: CRP: 1 mg/L (ref 0–10)

## 2021-02-20 DIAGNOSIS — L905 Scar conditions and fibrosis of skin: Secondary | ICD-10-CM | POA: Diagnosis not present

## 2021-02-20 DIAGNOSIS — D1801 Hemangioma of skin and subcutaneous tissue: Secondary | ICD-10-CM | POA: Diagnosis not present

## 2021-02-20 DIAGNOSIS — D225 Melanocytic nevi of trunk: Secondary | ICD-10-CM | POA: Diagnosis not present

## 2021-02-20 DIAGNOSIS — I781 Nevus, non-neoplastic: Secondary | ICD-10-CM | POA: Diagnosis not present

## 2021-02-20 DIAGNOSIS — L579 Skin changes due to chronic exposure to nonionizing radiation, unspecified: Secondary | ICD-10-CM | POA: Diagnosis not present

## 2021-02-20 DIAGNOSIS — C44612 Basal cell carcinoma of skin of right upper limb, including shoulder: Secondary | ICD-10-CM | POA: Diagnosis not present

## 2021-02-20 DIAGNOSIS — L814 Other melanin hyperpigmentation: Secondary | ICD-10-CM | POA: Diagnosis not present

## 2021-02-20 DIAGNOSIS — L821 Other seborrheic keratosis: Secondary | ICD-10-CM | POA: Diagnosis not present

## 2021-02-20 DIAGNOSIS — D485 Neoplasm of uncertain behavior of skin: Secondary | ICD-10-CM | POA: Diagnosis not present

## 2021-03-12 DIAGNOSIS — L905 Scar conditions and fibrosis of skin: Secondary | ICD-10-CM | POA: Diagnosis not present

## 2021-03-12 DIAGNOSIS — C44612 Basal cell carcinoma of skin of right upper limb, including shoulder: Secondary | ICD-10-CM | POA: Diagnosis not present

## 2021-03-13 NOTE — Progress Notes (Signed)
Jean [name] Date of Birth: [birth year]   History of Present Illness: [name] is seen today for yearly followup. She has a history of SVT in the past and PVCs. Echo in 2012 showed normal LV function. Event monitor then showed predominantly PVCs with only a 4 beat run of PAT.   She was seen by Almyra Deforest PA-C for a televisit in May with complaints of increased palpitations. Zio monitor was placed. This demonstrated Afib with RVR rate ranging from 64-196. Burden 11%. Average HR 124. Nonsustained runs of atrial tachycardia. Metoprolol increased to 25 mg bid. Started on Eliquis and ASA discontinued. Echo showed normal EF with mild AI and mild LVH.   On follow up today she states she still notices irregular rhythms but rate is controlled. She denies any chest pain, dizziness, syncope, SOB. Tolerating medication well.   Current Outpatient Medications on File Prior to Visit  Medication Sig Dispense Refill  . apixaban (ELIQUIS) 5 MG TABS tablet Take 1 tablet (5 mg total) by mouth 2 (two) times daily. 60 tablet 6  . diltiazem (CARDIZEM CD) 180 MG 24 hr capsule Take 1 capsule (180 mg total) by mouth daily. 90 capsule 1  . Fexofenadine HCl (ALLEGRA PO) Take 1 tablet by mouth daily as needed.     . rosuvastatin (CRESTOR) 20 MG tablet Take 1 tablet (20 mg total) by mouth daily. 90 tablet 1  . sertraline (ZOLOFT) 50 MG tablet Take 1 tablet (50 mg total) by mouth daily. 90 tablet 1  . valsartan-hydrochlorothiazide (DIOVAN-HCT) 160-12.5 MG tablet Take 1 tablet by mouth daily. 90 tablet 1   No current facility-administered medications on file prior to visit.    No Known Allergies  Past Medical History:  Diagnosis Date  . Gestational diabetes    WITH HER CHILDBIRTH  . Hyperlipidemia   . Hypertension   . Mitral insufficiency    TRIVIAL  . Palpitations   . PVC's (premature ventricular contractions) 03/16/2014  . SVT (supraventricular tachycardia) (HCC)     Past Surgical History:  Procedure  Laterality Date  . US ECHOCARDIOGRAPHY     EF 60%    Social History   Tobacco Use  Smoking Status Never Smoker  Smokeless Tobacco Never Used    Social History   Substance and Sexual Activity  Alcohol Use No    Family History  Problem Relation Age of Onset  . Heart disease Mother   . Hypertension Sister   . Diabetes Sister   . Anemia Paternal Grandmother     Review of Systems: As noted in HPI. All other systems were reviewed and are negative.  Physical Exam: BP 118/68 (BP Location: Left Arm, Patient Position: Sitting)   Pulse (!) 58   Ht 5\' 6"  (1.676 m)   Wt 159 lb 12.8 oz (72.5 kg)   SpO2 94%   BMI 25.79 kg/m  GENERAL:  Well appearing WF in NAD HEENT:  PERRL, EOMI, sclera are clear. Oropharynx is clear. NECK:  No jugular venous distention, carotid upstroke brisk and symmetric, no bruits, no thyromegaly or adenopathy LUNGS:  Clear to auscultation bilaterally CHEST:  Unremarkable HEART:  RRR,  PMI not displaced or sustained,S1 and S2 within normal limits, no S3, no S4: no clicks, no rubs, no murmurs ABD:  Soft, nontender. BS +, no masses or bruits. No hepatomegaly, no splenomegaly EXT:  2 + pulses throughout, no edema, no cyanosis no clubbing SKIN:  Warm and dry.  No rashes NEURO:  Alert and oriented  x 3. Cranial nerves II through XII intact. PSYCH:  Cognitively intact    LABORATORY DATA:  Lab Results  Component Value Date   WBC 5.8 01/15/2021   HGB 15.7 01/15/2021   HCT 44.8 01/15/2021   PLT 138 (L) 01/15/2021   GLUCOSE 100 (H) 01/15/2021   CHOL 148 01/15/2021   TRIG 111 01/15/2021   HDL 44 01/15/2021   LDLCALC 84 01/15/2021   ALT 18 01/15/2021   AST 19 01/15/2021   NA 142 01/15/2021   K 4.1 01/15/2021   CL 102 01/15/2021   CREATININE 0.92 01/15/2021   BUN 21 01/15/2021   CO2 23 01/15/2021   TSH 1.530 05/12/2019   HGBA1C 5.7 10/28/2018    Ecg today shows NSR rate 58. Normal. I have personally reviewed and interpreted this study.  Echo  07/04/19: IMPRESSIONS    1. The left ventricle has normal systolic function with an ejection fraction of 60-65%. The cavity size was normal. There is mildly increased left ventricular wall thickness. Left ventricular diastolic Doppler parameters are indeterminate.  2. The right ventricle has normal systolic function. The cavity was normal. There is no increase in right ventricular wall thickness.  3. The aortic valve is tricuspid. Mild thickening of the aortic valve. Aortic valve regurgitation is mild by color flow Doppler.  Zio monitor: 06/28/19: Study Highlights   NSR  Atrial fibrillation with RVR. Burden 11%. HR range 64-196 with average 124 bpm  Nonsustained runs of atrial tachycardia.     Assessment / Plan: 1. Paroxysmal Afib with RVR. Now on Eliquis. [place] vasc score of 3. Afib burden low so will continue with rate control strategy. Doing well on diltizazem.  We discussed options of AAD therapy or ablation if symptoms/duration of AFib should significantly worsen.   2. PAT/ PVCs. Under fair control  on beta blocker. Avoid stimulants.   2. Hypertension, well controlled   3. Hyperlipidemia. On Crestor. LDL 84.   Follow up in one year

## 2021-03-15 ENCOUNTER — Encounter: Payer: Self-pay | Admitting: Cardiology

## 2021-03-15 ENCOUNTER — Ambulatory Visit: Payer: Medicare PPO | Admitting: Cardiology

## 2021-03-15 ENCOUNTER — Other Ambulatory Visit: Payer: Self-pay

## 2021-03-15 VITALS — BP 118/68 | HR 58 | Ht 66.0 in | Wt 159.8 lb

## 2021-03-15 DIAGNOSIS — I480 Paroxysmal atrial fibrillation: Secondary | ICD-10-CM

## 2021-03-15 DIAGNOSIS — E785 Hyperlipidemia, unspecified: Secondary | ICD-10-CM | POA: Diagnosis not present

## 2021-03-15 DIAGNOSIS — I10 Essential (primary) hypertension: Secondary | ICD-10-CM | POA: Diagnosis not present

## 2021-04-29 ENCOUNTER — Ambulatory Visit: Payer: Medicare PPO

## 2021-04-29 ENCOUNTER — Other Ambulatory Visit: Payer: Self-pay | Admitting: Nurse Practitioner

## 2021-04-29 ENCOUNTER — Other Ambulatory Visit: Payer: Self-pay

## 2021-04-29 ENCOUNTER — Ambulatory Visit
Admission: RE | Admit: 2021-04-29 | Discharge: 2021-04-29 | Disposition: A | Discharge status: Home / Self Care | Payer: Medicare PPO | Source: Ambulatory Visit | Attending: Nurse Practitioner | Admitting: Nurse Practitioner

## 2021-04-29 DIAGNOSIS — N6489 Other specified disorders of breast: Secondary | ICD-10-CM | POA: Diagnosis not present

## 2021-04-29 DIAGNOSIS — R928 Other abnormal and inconclusive findings on diagnostic imaging of breast: Secondary | ICD-10-CM

## 2021-05-07 DIAGNOSIS — Z6825 Body mass index (BMI) 25.0-25.9, adult: Secondary | ICD-10-CM | POA: Diagnosis not present

## 2021-05-07 DIAGNOSIS — Z01419 Encounter for gynecological examination (general) (routine) without abnormal findings: Secondary | ICD-10-CM | POA: Diagnosis not present

## 2021-07-16 ENCOUNTER — Ambulatory Visit: Payer: Medicare PPO | Admitting: Nurse Practitioner

## 2021-07-17 ENCOUNTER — Other Ambulatory Visit: Payer: Self-pay | Admitting: Nurse Practitioner

## 2021-07-17 DIAGNOSIS — I10 Essential (primary) hypertension: Secondary | ICD-10-CM

## 2021-07-18 ENCOUNTER — Encounter: Payer: Self-pay | Admitting: Nurse Practitioner

## 2021-07-19 ENCOUNTER — Ambulatory Visit: Payer: Medicare PPO | Admitting: Nurse Practitioner

## 2021-07-19 ENCOUNTER — Encounter: Payer: Self-pay | Admitting: Nurse Practitioner

## 2021-07-19 ENCOUNTER — Other Ambulatory Visit: Payer: Self-pay

## 2021-07-19 DIAGNOSIS — K591 Functional diarrhea: Secondary | ICD-10-CM | POA: Diagnosis not present

## 2021-07-19 DIAGNOSIS — R509 Fever, unspecified: Secondary | ICD-10-CM

## 2021-07-19 LAB — VERITOR FLU A/B WAIVED
Influenza A: NEGATIVE
Influenza B: NEGATIVE

## 2021-07-19 NOTE — Progress Notes (Signed)
   Virtual Visit  Note Due to COVID-19 pandemic this visit was conducted virtually. This visit type was conducted due to national recommendations for restrictions regarding the COVID-19 Pandemic (e.g. social distancing, sheltering in place) in an effort to limit this patient's exposure and mitigate transmission in our community. All issues noted in this document were discussed and addressed.  A physical exam was not performed with this format.  I connected with Jean [name] on 07/19/21 at 10:15 by telephone and verified that I am speaking with the correct person using two identifiers. Jean [name] is currently located at our parking lot and no one is currently with her during visit. The provider, Mary-Margaret Hassell Done, FNP is located in their office at time of visit.  I discussed the limitations, risks, security and privacy concerns of performing an evaluation and management service by telephone and the availability of in person appointments. I also discussed with the patient that there may be a patient responsible charge related to this service. The patient expressed understanding and agreed to proceed.   History and Present Illness:   Chief Complaint: Fever   HPI Patient was suppose to be seen for chronic follow up today but is sick today. She says she developed diarrhea and fever yesterday. Did home covid test yesterday but negative. She wants  to be checked for covid and flu today.    Review of Systems  Constitutional:  Positive for chills, fever and malaise/fatigue.  HENT:  Negative for congestion and sore throat.   Respiratory:  Negative for cough and sputum production.   Gastrointestinal:  Positive for diarrhea. Negative for abdominal pain, nausea and vomiting.  Neurological:  Positive for tingling. Negative for headaches.  All other systems reviewed and are negative.   Observations/Objective: Alert and oriented- answers all questions appropriately No  distress   Assessment and Plan: Jean [name] in today with chief complaint of Fever   1. Fever, unspecified fever cause  2. Functional diarrhea Imodium AD  Force fluids Test results pending     Follow Up Instructions: prn    I discussed the assessment and treatment plan with the patient. The patient was provided an opportunity to ask questions and all were answered. The patient agreed with the plan and demonstrated an understanding of the instructions.   The patient was advised to call back or seek an in-person evaluation if the symptoms worsen or if the condition fails to improve as anticipated.  The above assessment and management plan was discussed with the patient. The patient verbalized understanding of and has agreed to the management plan. Patient is aware to call the clinic if symptoms persist or worsen. Patient is aware when to return to the clinic for a follow-up visit. Patient educated on when it is appropriate to go to the emergency department.   Time call ended:  10:32  I provided 12 minutes of  non face-to-face time during this encounter.    Mary-Margaret Hassell Done, FNP

## 2021-07-19 NOTE — Progress Notes (Deleted)
   Subjective:    Patient ID: Jean [name], female    DOB: 1951/02/04, 70 y.o.   MRN: [id number]   Chief Complaint: medical management of chronic issues     HPI:  1. Primary hypertension No c/o chest pain, sob or headche. Does not check blood pressure at home. BP Readings from Last 3 Encounters:  03/15/21 118/68  01/15/21 121/78  09/14/20 (!) 148/76     2. Pure hypercholesterolemia Doe stry to watch diet. Walks several times a week. Lab Results  Component Value Date   CHOL 148 01/15/2021   HDL 44 01/15/2021   LDLCALC 84 01/15/2021   TRIG 111 01/15/2021   CHOLHDL 3.4 01/15/2021     3. SVT (supraventricular tachycardia) (HCC) No runs of svt recently  4. Recurrent major depressive disorder, in full remission ([place]) Has been on zoloft for several years and that is working well for her.  5. BMI 26.0-26.9,adult No recent weight changes    Outpatient Encounter Medications as of 07/19/2021  Medication Sig   apixaban (ELIQUIS) 5 MG TABS tablet Take 1 tablet (5 mg total) by mouth 2 (two) times daily.   diltiazem (CARDIZEM CD) 180 MG 24 hr capsule Take 1 capsule (180 mg total) by mouth daily.   Fexofenadine HCl (ALLEGRA PO) Take 1 tablet by mouth daily as needed.    rosuvastatin (CRESTOR) 20 MG tablet Take 1 tablet (20 mg total) by mouth daily.   sertraline (ZOLOFT) 50 MG tablet Take 1 tablet (50 mg total) by mouth daily.   valsartan-hydrochlorothiazide (DIOVAN-HCT) 160-12.5 MG tablet Take 1 tablet by mouth daily. (NEEDS TO BE SEEN BEFORE NEXT REFILL)   No facility-administered encounter medications on file as of 07/19/2021.    Past Surgical History:  Procedure Laterality Date   US ECHOCARDIOGRAPHY     EF 60%    Family History  Problem Relation Age of Onset   Heart disease Mother    Hypertension Sister    Diabetes Sister    Anemia Paternal Grandmother     New complaints: ***  Social history: ***  Controlled substance contract: ***     Review of  Systems     Objective:   Physical Exam        Assessment & Plan:

## 2021-07-20 LAB — SARS-COV-2, NAA 2 DAY TAT

## 2021-07-20 LAB — NOVEL CORONAVIRUS, NAA: SARS-CoV-2, NAA: NOT DETECTED

## 2021-07-22 DIAGNOSIS — H0102A Squamous blepharitis right eye, upper and lower eyelids: Secondary | ICD-10-CM | POA: Diagnosis not present

## 2021-07-22 DIAGNOSIS — H0102B Squamous blepharitis left eye, upper and lower eyelids: Secondary | ICD-10-CM | POA: Diagnosis not present

## 2021-07-22 DIAGNOSIS — H0288A Meibomian gland dysfunction right eye, upper and lower eyelids: Secondary | ICD-10-CM | POA: Diagnosis not present

## 2021-07-22 DIAGNOSIS — H353131 Nonexudative age-related macular degeneration, bilateral, early dry stage: Secondary | ICD-10-CM | POA: Diagnosis not present

## 2021-07-22 DIAGNOSIS — H0288B Meibomian gland dysfunction left eye, upper and lower eyelids: Secondary | ICD-10-CM | POA: Diagnosis not present

## 2021-07-22 DIAGNOSIS — H25813 Combined forms of age-related cataract, bilateral: Secondary | ICD-10-CM | POA: Diagnosis not present

## 2021-07-22 DIAGNOSIS — H04123 Dry eye syndrome of bilateral lacrimal glands: Secondary | ICD-10-CM | POA: Diagnosis not present

## 2021-07-27 ENCOUNTER — Other Ambulatory Visit: Payer: Self-pay | Admitting: Nurse Practitioner

## 2021-07-27 DIAGNOSIS — F3342 Major depressive disorder, recurrent, in full remission: Secondary | ICD-10-CM

## 2021-08-07 DIAGNOSIS — I4891 Unspecified atrial fibrillation: Secondary | ICD-10-CM | POA: Diagnosis not present

## 2021-08-07 DIAGNOSIS — Z7901 Long term (current) use of anticoagulants: Secondary | ICD-10-CM | POA: Diagnosis not present

## 2021-08-07 DIAGNOSIS — Z833 Family history of diabetes mellitus: Secondary | ICD-10-CM | POA: Diagnosis not present

## 2021-08-07 DIAGNOSIS — I10 Essential (primary) hypertension: Secondary | ICD-10-CM | POA: Diagnosis not present

## 2021-08-07 DIAGNOSIS — E785 Hyperlipidemia, unspecified: Secondary | ICD-10-CM | POA: Diagnosis not present

## 2021-08-07 DIAGNOSIS — Z8249 Family history of ischemic heart disease and other diseases of the circulatory system: Secondary | ICD-10-CM | POA: Diagnosis not present

## 2021-08-07 DIAGNOSIS — F419 Anxiety disorder, unspecified: Secondary | ICD-10-CM | POA: Diagnosis not present

## 2021-08-07 DIAGNOSIS — J309 Allergic rhinitis, unspecified: Secondary | ICD-10-CM | POA: Diagnosis not present

## 2021-08-07 DIAGNOSIS — R32 Unspecified urinary incontinence: Secondary | ICD-10-CM | POA: Diagnosis not present

## 2021-08-09 ENCOUNTER — Other Ambulatory Visit: Payer: Self-pay | Admitting: Nurse Practitioner

## 2021-08-09 DIAGNOSIS — I10 Essential (primary) hypertension: Secondary | ICD-10-CM

## 2021-08-09 NOTE — Telephone Encounter (Signed)
MMM NTBS 30 days given 07/17/21

## 2021-08-12 ENCOUNTER — Ambulatory Visit (INDEPENDENT_AMBULATORY_CARE_PROVIDER_SITE_OTHER): Payer: Medicare PPO | Admitting: Nurse Practitioner

## 2021-08-12 ENCOUNTER — Encounter: Payer: Self-pay | Admitting: Nurse Practitioner

## 2021-08-12 ENCOUNTER — Other Ambulatory Visit: Payer: Self-pay

## 2021-08-12 VITALS — BP 130/69 | HR 57 | Temp 97.5°F | Resp 20 | Ht 66.0 in | Wt 158.0 lb

## 2021-08-12 DIAGNOSIS — I10 Essential (primary) hypertension: Secondary | ICD-10-CM | POA: Diagnosis not present

## 2021-08-12 DIAGNOSIS — Z23 Encounter for immunization: Secondary | ICD-10-CM

## 2021-08-12 DIAGNOSIS — I480 Paroxysmal atrial fibrillation: Secondary | ICD-10-CM | POA: Diagnosis not present

## 2021-08-12 DIAGNOSIS — E7800 Pure hypercholesterolemia, unspecified: Secondary | ICD-10-CM

## 2021-08-12 DIAGNOSIS — F3342 Major depressive disorder, recurrent, in full remission: Secondary | ICD-10-CM

## 2021-08-12 DIAGNOSIS — Z6825 Body mass index (BMI) 25.0-25.9, adult: Secondary | ICD-10-CM | POA: Diagnosis not present

## 2021-08-12 DIAGNOSIS — I471 Supraventricular tachycardia: Secondary | ICD-10-CM | POA: Diagnosis not present

## 2021-08-12 LAB — CBC WITH DIFFERENTIAL/PLATELET
Basophils Absolute: 0 10*3/uL (ref 0.0–0.2)
Basos: 1 %
EOS (ABSOLUTE): 0.1 10*3/uL (ref 0.0–0.4)
Eos: 2 %
Hematocrit: 40.1 % (ref 34.0–46.6)
Hemoglobin: 13.5 g/dL (ref 11.1–15.9)
Immature Grans (Abs): 0 10*3/uL (ref 0.0–0.1)
Immature Granulocytes: 0 %
Lymphocytes Absolute: 0.7 10*3/uL (ref 0.7–3.1)
Lymphs: 16 %
MCH: 30.3 pg (ref 26.6–33.0)
MCHC: 33.7 g/dL (ref 31.5–35.7)
MCV: 90 fL (ref 79–97)
Monocytes Absolute: 0.4 10*3/uL (ref 0.1–0.9)
Monocytes: 9 %
Neutrophils Absolute: 3 10*3/uL (ref 1.4–7.0)
Neutrophils: 72 %
Platelets: 106 10*3/uL — ABNORMAL LOW (ref 150–450)
RBC: 4.45 x10E6/uL (ref 3.77–5.28)
RDW: 12.3 % (ref 11.7–15.4)
WBC: 4.1 10*3/uL (ref 3.4–10.8)

## 2021-08-12 LAB — CMP14+EGFR
ALT: 16 IU/L (ref 0–32)
AST: 18 IU/L (ref 0–40)
Albumin/Globulin Ratio: 1.5 (ref 1.2–2.2)
Albumin: 4.1 g/dL (ref 3.7–4.7)
Alkaline Phosphatase: 102 IU/L (ref 44–121)
BUN/Creatinine Ratio: 19 (ref 12–28)
BUN: 15 mg/dL (ref 8–27)
Bilirubin Total: 0.6 mg/dL (ref 0.0–1.2)
CO2: 25 mmol/L (ref 20–29)
Calcium: 9.2 mg/dL (ref 8.7–10.3)
Chloride: 102 mmol/L (ref 96–106)
Creatinine, Ser: 0.81 mg/dL (ref 0.57–1.00)
Globulin, Total: 2.7 g/dL (ref 1.5–4.5)
Glucose: 115 mg/dL — ABNORMAL HIGH (ref 65–99)
Potassium: 4.2 mmol/L (ref 3.5–5.2)
Sodium: 141 mmol/L (ref 134–144)
Total Protein: 6.8 g/dL (ref 6.0–8.5)
eGFR: 78 mL/min/{1.73_m2} (ref >59)

## 2021-08-12 LAB — LIPID PANEL
Chol/HDL Ratio: 3.2 ratio (ref 0.0–4.4)
Cholesterol, Total: 132 mg/dL (ref 100–199)
HDL: 41 mg/dL (ref >39)
LDL Chol Calc (NIH): 73 mg/dL (ref 0–99)
Triglycerides: 94 mg/dL (ref 0–149)
VLDL Cholesterol Cal: 18 mg/dL (ref 5–40)

## 2021-08-12 MED ORDER — ROSUVASTATIN CALCIUM 20 MG PO TABS: 20.0000 mg | ORAL_TABLET | Freq: Every day | ORAL | 1 refills | Status: DC

## 2021-08-12 MED ORDER — SERTRALINE HCL 50 MG PO TABS: 50.0000 mg | ORAL_TABLET | Freq: Every day | ORAL | 0 refills | Status: DC

## 2021-08-12 MED ORDER — DILTIAZEM HCL ER COATED BEADS 180 MG PO CP24: 180.0000 mg | ORAL_CAPSULE | Freq: Every day | ORAL | 1 refills | Status: DC

## 2021-08-12 MED ORDER — APIXABAN 5 MG PO TABS: 5.0000 mg | ORAL_TABLET | Freq: Two times a day (BID) | ORAL | 6 refills | Status: DC

## 2021-08-12 MED ORDER — VALSARTAN-HYDROCHLOROTHIAZIDE 160-12.5 MG PO TABS: 1.0000 | ORAL_TABLET | Freq: Every day | ORAL | 0 refills | Status: DC

## 2021-08-12 NOTE — Progress Notes (Signed)
Subjective:    Patient ID: Jean [name], female    DOB: 01/21/1951, 70 y.o.   MRN: [id number]   Chief Complaint: medical management of chronic issues     HPI:  1. Primary hypertension No c/o chest pain, sob or  headache. Does not check blood pressure at home. BP Readings from Last 3 Encounters:  03/15/21 118/68  01/15/21 121/78  09/14/20 (!) 148/76     2. Pure hypercholesterolemia Does try to watch diet but doe snot exercise as often as she should. Lab Results  Component Value Date   CHOL 148 01/15/2021   HDL 44 01/15/2021   LDLCALC 84 01/15/2021   TRIG 111 01/15/2021   CHOLHDL 3.4 01/15/2021     3. SVT (supraventricular tachycardia) ([place]) She is on diltiazem daily. Has occasional feelings of heart racing but does not last long.  4. Paroxysmal Atrial fib She is on diltiazem and eliquis. Last saw cardiology in March 2022. According to office note- no changes were made to plan of care. Will do ablation if becomes necessary.  5. Recurrent major depressive disorder, in full remission ([place]) Is currently on zoloft and is doing well. No medication side effects. Depression screen [facility] 2/9 08/12/2021 01/15/2021 07/13/2020  Decreased Interest 0 0 0  Down, Depressed, Hopeless 0 0 -  PHQ - 2 Score 0 0 0  Altered sleeping 1 0 -  Tired, decreased energy 0 0 -  Change in appetite 0 0 -  Feeling bad or failure about yourself  0 0 -  Trouble concentrating 0 0 -  Moving slowly or fidgety/restless 0 0 -  Suicidal thoughts 0 0 -  PHQ-9 Score 1 0 -  Difficult doing work/chores Not difficult at all Not difficult at all -     6. BMI 25.0-25.9,adult No recent weight changes Wt Readings from Last 3 Encounters:  08/12/21 158 lb (71.7 kg)  03/15/21 159 lb 12.8 oz (72.5 kg)  01/15/21 160 lb (72.6 kg)   BMI Readings from Last 3 Encounters:  08/12/21 25.50 kg/m  03/15/21 25.79 kg/m  01/15/21 25.82 kg/m      Outpatient Encounter Medications as of 08/12/2021  Medication Sig    apixaban (ELIQUIS) 5 MG TABS tablet Take 1 tablet (5 mg total) by mouth 2 (two) times daily.   diltiazem (CARDIZEM CD) 180 MG 24 hr capsule Take 1 capsule (180 mg total) by mouth daily.   Fexofenadine HCl (ALLEGRA PO) Take 1 tablet by mouth daily as needed.    rosuvastatin (CRESTOR) 20 MG tablet Take 1 tablet (20 mg total) by mouth daily.   sertraline (ZOLOFT) 50 MG tablet Take 1 tablet (50 mg total) by mouth daily. (NEEDS TO BE SEEN BEFORE NEXT REFILL)   valsartan-hydrochlorothiazide (DIOVAN-HCT) 160-12.5 MG tablet Take 1 tablet by mouth daily. (NEEDS TO BE SEEN BEFORE NEXT REFILL)   No facility-administered encounter medications on file as of 08/12/2021.    Past Surgical History:  Procedure Laterality Date   US ECHOCARDIOGRAPHY     EF 60%    Family History  Problem Relation Age of Onset   Heart disease Mother    Hypertension Sister    Diabetes Sister    Anemia Paternal Grandmother     New complaints: None today  Social history: Lives with her husband  Controlled substance contract: n/a     Review of Systems  Constitutional:  Negative for diaphoresis.  Eyes:  Negative for pain.  Respiratory:  Negative for shortness of breath.  Cardiovascular:  Negative for chest pain, palpitations and leg swelling.  Gastrointestinal:  Negative for abdominal pain.  Endocrine: Negative for polydipsia.  Skin:  Negative for rash.  Neurological:  Negative for dizziness, weakness and headaches.  Hematological:  Does not bruise/bleed easily.  All other systems reviewed and are negative.     Objective:   Physical Exam Vitals and nursing note reviewed.  Constitutional:      General: She is not in acute distress.    Appearance: Normal appearance. She is well-developed.  HENT:     Head: Normocephalic.     Right Ear: Tympanic membrane normal.     Left Ear: Tympanic membrane normal.     Nose: Nose normal.     Mouth/Throat:     Mouth: Mucous membranes are moist.  Eyes:     Pupils:  Pupils are equal, round, and reactive to light.  Neck:     Vascular: No carotid bruit or JVD.  Cardiovascular:     Rate and Rhythm: Normal rate and regular rhythm.     Heart sounds: Normal heart sounds.  Pulmonary:     Effort: Pulmonary effort is normal. No respiratory distress.     Breath sounds: Normal breath sounds. No wheezing or rales.  Chest:     Chest wall: No tenderness.  Abdominal:     General: Bowel sounds are normal. There is no distension or abdominal bruit.     Palpations: Abdomen is soft. There is no hepatomegaly, splenomegaly, mass or pulsatile mass.     Tenderness: There is no abdominal tenderness.  Musculoskeletal:        General: Normal range of motion.     Cervical back: Normal range of motion and neck supple.  Lymphadenopathy:     Cervical: No cervical adenopathy.  Skin:    General: Skin is warm and dry.  Neurological:     Mental Status: She is alert and oriented to person, place, and time.     Deep Tendon Reflexes: Reflexes are normal and symmetric.  Psychiatric:        Behavior: Behavior normal.        Thought Content: Thought content normal.        Judgment: Judgment normal.   BP 130/69   Pulse (!) 57   Temp (!) 97.5 F (36.4 C) (Temporal)   Resp 20   Ht '5\' 6"'$  (1.676 m)   Wt 158 lb (71.7 kg)   SpO2 97%   BMI 25.50 kg/m         Assessment & Plan:  KAELE OPLINGER comes in today with chief complaint of Medical Management of Chronic Issues   Diagnosis and orders addressed:  1. Primary hypertension Low sodium diet - valsartan-hydrochlorothiazide (DIOVAN-HCT) 160-12.5 MG tablet; Take 1 tablet by mouth daily. (NEEDS TO BE SEEN BEFORE NEXT REFILL)  Dispense: 30 tablet; Refill: 0  2. Pure hypercholesterolemia Low fat diet - rosuvastatin (CRESTOR) 20 MG tablet; Take 1 tablet (20 mg total) by mouth daily.  Dispense: 90 tablet; Refill: 1  3. SVT (supraventricular tachycardia) (HCC) Avoid caffeine - apixaban (ELIQUIS) 5 MG TABS tablet; Take 1  tablet (5 mg total) by mouth 2 (two) times daily.  Dispense: 60 tablet; Refill: 6 - diltiazem (CARDIZEM CD) 180 MG 24 hr capsule; Take 1 capsule (180 mg total) by mouth daily.  Dispense: 90 capsule; Refill: 1  4. Recurrent major depressive disorder, in full remission ([place]) Stress managment - sertraline (ZOLOFT) 50 MG tablet; Take 1 tablet (50 mg  total) by mouth daily. (NEEDS TO BE SEEN BEFORE NEXT REFILL)  Dispense: 30 tablet; Refill: 0  5. BMI 25.0-25.9,adult Discussed diet and exercise for person with BMI >25 Will recheck weight in 3-6 months   6. Paroxysmal atrial fibrillation (HCC) Keep follow up with cardiology every 6 months.   Labs pending Health Maintenance reviewed Diet and exercise encouraged  Follow up plan: 6 months   Mary-Margaret Hassell Done, FNP

## 2021-08-12 NOTE — Patient Instructions (Signed)
Recombinant Zoster (Shingles) Vaccine: What You Need to Know 1. Why get vaccinated? Recombinant zoster (shingles) vaccine can prevent shingles. Shingles (also called herpes zoster, or just zoster) is a painful skin rash, usually with blisters. In addition to the rash, shingles can cause fever, headache, chills, or upset stomach. More rarely, shingles can lead to pneumonia, hearingproblems, blindness, brain inflammation (encephalitis), or death. The most common complication of shingles is long-term nerve pain called postherpetic neuralgia (PHN). PHN occurs in the areas where the shingles rash was, even after the rash clears up. It can last for months or years after therash goes away. The pain from PHN can be severe and debilitating. About 10 to 18% of people who get shingles will experience PHN. The risk of PHN increases with age. An older adult with shingles is more likely to develop PHN and have longer lasting and more severe pain than a younger person withshingles. Shingles is caused by the varicella zoster virus, the same virus that causes chickenpox. After you have chickenpox, the virus stays in your body and can cause shingles later in life. Shingles cannot be passed from one person to another, but the virus that causes shingles can spread and cause chickenpox insomeone who had never had chickenpox or received chickenpox vaccine. 2. Recombinant shingles vaccine Recombinant shingles vaccine provides strong protection against shingles. Bypreventing shingles, recombinant shingles vaccine also protects against PHN. Recombinant shingles vaccine is the preferred vaccine for the prevention of shingles. However, a different vaccine, live shingles vaccine, may be used in somecircumstances. The recombinant shingles vaccine is recommended for adults 50 years and older without serious immune problems. It is given as a two-dose series. This vaccine is also recommended for people who have already gotten another  type of shingles vaccine, the live shingles vaccine. There is no live virus inthis vaccine. Shingles vaccine may be given at the same time as other vaccines. 3. Talk with your health care provider Tell your vaccine provider if the person getting the vaccine: Has had an allergic reaction after a previous dose of recombinant shingles vaccine, or has any severe, life-threatening allergies. Is pregnant or breastfeeding. Is currently experiencing an episode of shingles. In some cases, your health care provider may decide to postpone shinglesvaccination to a future visit. People with minor illnesses, such as a cold, may be vaccinated. People who are moderately or severely ill should usually wait until they recover beforegetting recombinant shingles vaccine. Your health care provider can give you more information. 4. Risks of a vaccine reaction A sore arm with mild or moderate pain is very common after recombinant shingles vaccine, affecting about 80% of vaccinated people. Redness and swelling can also happen at the site of the injection. Tiredness, muscle pain, headache, shivering, fever, stomach pain, and nausea happen after vaccination in more than half of people who receive recombinant shingles vaccine. In clinical trials, about 1 out of 6 people who got recombinant zoster vaccine experienced side effects that prevented them from doing regular activities.Symptoms usually went away on their own in 2 to 3 days. You should still get the second dose of recombinant zoster vaccine even if youhad one of these reactions after the first dose. People sometimes faint after medical procedures, including vaccination. Tellyour provider if you feel dizzy or have vision changes or ringing in the ears. As with any medicine, there is a very remote chance of a vaccine causing asevere allergic reaction, other serious injury, or death. 5. What if there is a serious problem? An  allergic reaction could occur after the  vaccinated person leaves the clinic. If you see signs of a severe allergic reaction (hives, swelling of the face and throat, difficulty breathing, a fast heartbeat, dizziness, or weakness), call 9-1-1 and get the person to the nearest hospital. For other signs that concern you, call your health care provider. Adverse reactions should be reported to the Vaccine Adverse Event Reporting System (VAERS). Your health care provider will usually file this report, or you can do it yourself. Visit the VAERS website at www.vaers.SamedayNews.es or call 850-095-8981. VAERS is only for reporting reactions, and VAERS staff do not give medical advice. 6. How can I learn more? Ask your health care provider. Call your local or state health department. Contact the Centers for Disease Control and Prevention (CDC): Call (859)610-6682 (1-800-CDC-INFO) or Visit CDC's website at http://hunter.com/ Vaccine Information Statement Recombinant Zoster Vaccine (10/20/2018) This information is not intended to replace advice given to you by your health care provider. Make sure you discuss any questions you have with your healthcare provider. Document Revised: 08/10/2020 Document Reviewed: 08/10/2020 Elsevier Patient Education  [place].

## 2021-08-12 NOTE — Addendum Note (Signed)
Addended by: Rolena Infante on: 08/12/2021 09:35 AM   Modules accepted: Orders

## 2021-08-12 NOTE — Addendum Note (Signed)
Addended by: Rolena Infante on: 08/12/2021 09:55 AM   Modules accepted: Orders

## 2021-08-23 ENCOUNTER — Other Ambulatory Visit: Payer: Self-pay | Admitting: Nurse Practitioner

## 2021-08-23 DIAGNOSIS — F3342 Major depressive disorder, recurrent, in full remission: Secondary | ICD-10-CM

## 2021-09-10 DIAGNOSIS — L570 Actinic keratosis: Secondary | ICD-10-CM | POA: Diagnosis not present

## 2021-09-10 DIAGNOSIS — Z85828 Personal history of other malignant neoplasm of skin: Secondary | ICD-10-CM | POA: Diagnosis not present

## 2021-09-10 DIAGNOSIS — D1801 Hemangioma of skin and subcutaneous tissue: Secondary | ICD-10-CM | POA: Diagnosis not present

## 2021-09-10 DIAGNOSIS — L821 Other seborrheic keratosis: Secondary | ICD-10-CM | POA: Diagnosis not present

## 2021-09-10 DIAGNOSIS — L814 Other melanin hyperpigmentation: Secondary | ICD-10-CM | POA: Diagnosis not present

## 2021-09-10 DIAGNOSIS — L579 Skin changes due to chronic exposure to nonionizing radiation, unspecified: Secondary | ICD-10-CM | POA: Diagnosis not present

## 2021-09-16 ENCOUNTER — Other Ambulatory Visit: Payer: Self-pay | Admitting: Nurse Practitioner

## 2021-09-16 DIAGNOSIS — I10 Essential (primary) hypertension: Secondary | ICD-10-CM

## 2021-09-30 ENCOUNTER — Ambulatory Visit
Admission: RE | Admit: 2021-09-30 | Discharge: 2021-09-30 | Disposition: A | Discharge status: Home / Self Care | Payer: Medicare PPO | Source: Ambulatory Visit | Attending: Nurse Practitioner | Admitting: Nurse Practitioner

## 2021-09-30 ENCOUNTER — Other Ambulatory Visit: Payer: Self-pay

## 2021-09-30 ENCOUNTER — Ambulatory Visit: Payer: Medicare PPO

## 2021-09-30 DIAGNOSIS — R928 Other abnormal and inconclusive findings on diagnostic imaging of breast: Secondary | ICD-10-CM

## 2021-09-30 DIAGNOSIS — N6489 Other specified disorders of breast: Secondary | ICD-10-CM | POA: Diagnosis not present

## 2021-09-30 DIAGNOSIS — R922 Inconclusive mammogram: Secondary | ICD-10-CM | POA: Diagnosis not present

## 2021-12-04 ENCOUNTER — Other Ambulatory Visit: Payer: Self-pay | Admitting: Nurse Practitioner

## 2021-12-04 DIAGNOSIS — E7800 Pure hypercholesterolemia, unspecified: Secondary | ICD-10-CM

## 2021-12-04 DIAGNOSIS — F3342 Major depressive disorder, recurrent, in full remission: Secondary | ICD-10-CM

## 2021-12-11 ENCOUNTER — Encounter: Payer: Self-pay | Admitting: *Deleted

## 2021-12-27 ENCOUNTER — Ambulatory Visit (INDEPENDENT_AMBULATORY_CARE_PROVIDER_SITE_OTHER): Payer: Medicare PPO

## 2021-12-27 ENCOUNTER — Ambulatory Visit: Payer: Medicare PPO | Admitting: Nurse Practitioner

## 2021-12-27 ENCOUNTER — Encounter: Payer: Self-pay | Admitting: Nurse Practitioner

## 2021-12-27 VITALS — BP 124/72 | HR 74 | Temp 98.2°F | Ht 66.0 in | Wt 158.4 lb

## 2021-12-27 DIAGNOSIS — M25532 Pain in left wrist: Secondary | ICD-10-CM | POA: Diagnosis not present

## 2021-12-27 NOTE — Assessment & Plan Note (Signed)
Pain of left wrist not well controlled.  Symptoms present for the past 2 months.  No injuries or trauma associated with current symptoms.  Patient reports lifting weights in the gym, sewing, knitting and cooking a lot.  Advised patient to rest wrist joint, ice, or warm compress as tolerated, brace, anti-inflammatory like ibuprofen or Tylenol as tolerated.  Completed left wrist x-ray results pending.  Follow-up with worsening unresolved symptoms.

## 2021-12-27 NOTE — Patient Instructions (Signed)
Wrist Pain, Adult There are many things that can cause wrist pain. Some common causes include: An injury to the wrist. Using the joint too much. A condition that causes too much pressure to be put on a nerve in the wrist (carpal tunnel syndrome). Wear and tear of the joints that happens as a person gets older (osteoarthritis). A condition that causes swelling and stiffness in the joints (arthritis). Sometimes, the cause of wrist pain is not known. Often, the pain goes away when you follow your doctor's instructions for easing pain at home. This may include resting your wrist, icing your wrist, or using a splint or an elastic wrap for a short time. It is important to tell your doctor if your wrist pain does not go away. Follow these instructions at home: If you have a splint or elastic wrap: Wear the splint or wrap as told by your doctor. Take it off only as told by your doctor. Ask if you can take it off for bathing. Loosen the splint or wrap if your fingers: Tingle. Become numb. Turn cold and blue. Check the skin around the splint or wrap every day. Tell your doctor about any concerns. Keep the splint or wrap clean. If the splint or wrap is not waterproof: Do not let it get wet. Cover it with a watertight covering when you take a bath or shower. Managing pain, stiffness, and swelling  If told, put ice on the painful area. To do this: If you have a removable splint or wrap, take it off as told by your doctor. Put ice in a plastic bag. Place a towel between your skin and the bag or between your splint or wrap and the bag. Leave the ice on for 20 minutes, 2-3 times a day. Move your fingers often. Raise (elevate) the injured area above the level of your heart while you are sitting or lying down. Activity Rest your wrist as told by your doctor. Return to your normal activities as told by your doctor. Ask your doctor what activities are safe for you. Ask your doctor when it is safe to  drive if you have a splint or wrap on your wrist. Do exercises as told by your doctor. General instructions Pay attention to any changes in your symptoms. Take over-the-counter and prescription medicines only as told by your doctor. Keep all follow-up visits as told by your doctor. This is important. Contact a doctor if: You have a sudden, sharp pain in the wrist, hand, or arm that is different or new. The swelling or bruising on your wrist or hand gets worse. Your skin: Becomes red. Gets a rash. Has open sores. Your pain does not get better. Your pain gets worse. You have a fever or chills. Get help right away if: You lose feeling in your fingers or hand. Your fingers turn white, very red, or cold and blue. You cannot move your fingers. Summary There are many things that can cause wrist pain. It is important to tell your doctor if your wrist pain does not go away. You may need to wear a splint or a wrap for a short period of time. Return to your normal activities as told by your doctor. Ask your doctor what activities are safe for you. This information is not intended to replace advice given to you by your health care provider. Make sure you discuss any questions you have with your health care provider. Document Revised: 10/27/2019 Document Reviewed: 10/27/2019 Elsevier Patient Education  2022  Elsevier Inc. ° °

## 2021-12-27 NOTE — Progress Notes (Signed)
Acute Office Visit  Subjective:    Patient ID: Jean [name], female    DOB: 06-07-1951, 71 y.o.   MRN: [id number]  Chief Complaint  Patient presents with   Wrist Pain    Wrist Pain  This is a new problem. The current episode started more than 1 month ago. There has been no history of extremity trauma. The problem occurs intermittently. The problem has been unchanged. The quality of the pain is described as aching. The pain is moderate. Pertinent negatives include no joint swelling, numbness, stiffness or tingling. The symptoms are aggravated by activity. She has tried nothing for the symptoms.    Past Medical History:  Diagnosis Date   Gestational diabetes    WITH HER CHILDBIRTH   Hyperlipidemia    Hypertension    Mitral insufficiency    [facility]   Palpitations    PVC's (premature ventricular contractions) 03/16/2014   SVT (supraventricular tachycardia) (HCC)     Past Surgical History:  Procedure Laterality Date   BASAL CELL CARCINOMA EXCISION  2021   One on top of head and one on right arm   US ECHOCARDIOGRAPHY     EF 60%    Family History  Problem Relation Age of Onset   Heart disease Mother    Hypertension Sister    Diabetes Sister    Anemia Paternal Grandmother     Social History   Socioeconomic History   Marital status: Married    Spouse name: Not on file   Number of children: 1   Years of education: Not on file   Highest education level: Not on file  Occupational History   Occupation: Comptroller    Comment: retired  Tobacco Use   Smoking status: Never   Smokeless tobacco: Never  Vaping Use   Vaping Use: Never used  Substance and Sexual Activity   Alcohol use: No   Drug use: No   Sexual activity: Not on file  Other Topics Concern   Not on file  Social History Narrative   Not on file   Social Determinants of Health   Financial Resource Strain: Not on file  Food Insecurity: Not on file  Transportation Needs: Not on file  Physical  Activity: Not on file  Stress: Not on file  Social Connections: Not on file  Intimate Partner Violence: Not on file    Outpatient Medications Prior to Visit  Medication Sig Dispense Refill   acetaminophen (TYLENOL) 325 MG tablet Take 650 mg by mouth every 6 (six) hours as needed.     apixaban (ELIQUIS) 5 MG TABS tablet Take 1 tablet (5 mg total) by mouth 2 (two) times daily. 60 tablet 6   diltiazem (CARDIZEM CD) 180 MG 24 hr capsule Take 1 capsule (180 mg total) by mouth daily. 90 capsule 1   Fexofenadine HCl (ALLEGRA PO) Take 1 tablet by mouth daily as needed.      rosuvastatin (CRESTOR) 20 MG tablet TAKE 1 TABLET BY MOUTH EVERY DAY 90 tablet 0   sertraline (ZOLOFT) 50 MG tablet TAKE 1 TABLET BY MOUTH EVERY DAY 90 tablet 0   valsartan-hydrochlorothiazide (DIOVAN-HCT) 160-12.5 MG tablet Take 1 tablet by mouth daily. 90 tablet 1   No facility-administered medications prior to visit.    No Known Allergies  Review of Systems  HENT: Negative.    Eyes: Negative.   Respiratory: Negative.    Cardiovascular: Negative.   Gastrointestinal: Negative.   Musculoskeletal: Negative.  Negative for stiffness.  Skin:  Negative for rash.  Neurological:  Negative for tingling and numbness.  All other systems reviewed and are negative.     Objective:    Physical Exam Vitals and nursing note reviewed.  Constitutional:      Appearance: Normal appearance.  HENT:     Head: Normocephalic.  Cardiovascular:     Rate and Rhythm: Normal rate and regular rhythm.  Pulmonary:     Effort: Pulmonary effort is normal.     Breath sounds: Normal breath sounds.  Abdominal:     General: Bowel sounds are normal.  Musculoskeletal:     Left wrist: Tenderness present. No swelling or deformity. Decreased range of motion.  Skin:    General: Skin is warm.     Findings: No rash.  Neurological:     Mental Status: She is alert and oriented to person, place, and time.  Psychiatric:        Behavior: Behavior  normal.    BP 124/72    Pulse 74    Temp 98.2 F (36.8 C) (Temporal)    Ht $R'5\' 6"'dV$  (1.676 m)    Wt 158 lb 6 oz (71.8 kg)    BMI 25.56 kg/m  Wt Readings from Last 3 Encounters:  12/27/21 158 lb 6 oz (71.8 kg)  08/12/21 158 lb (71.7 kg)  03/15/21 159 lb 12.8 oz (72.5 kg)    Health Maintenance Due  Topic Date Due   TETANUS/TDAP  09/11/2021    There are no preventive care reminders to display for this patient.   Lab Results  Component Value Date   TSH 1.530 05/12/2019   Lab Results  Component Value Date   WBC 4.1 08/12/2021   HGB 13.5 08/12/2021   HCT 40.1 08/12/2021   MCV 90 08/12/2021   PLT 106 (L) 08/12/2021   Lab Results  Component Value Date   NA 141 08/12/2021   K 4.2 08/12/2021   CO2 25 08/12/2021   GLUCOSE 115 (H) 08/12/2021   BUN 15 08/12/2021   CREATININE 0.81 08/12/2021   BILITOT 0.6 08/12/2021   ALKPHOS 102 08/12/2021   AST 18 08/12/2021   ALT 16 08/12/2021   PROT 6.8 08/12/2021   ALBUMIN 4.1 08/12/2021   CALCIUM 9.2 08/12/2021   EGFR 78 08/12/2021   GFR 103.92 07/08/2011   Lab Results  Component Value Date   CHOL 132 08/12/2021   Lab Results  Component Value Date   HDL 41 08/12/2021   Lab Results  Component Value Date   LDLCALC 73 08/12/2021   Lab Results  Component Value Date   TRIG 94 08/12/2021   Lab Results  Component Value Date   CHOLHDL 3.2 08/12/2021   Lab Results  Component Value Date   HGBA1C 5.7 10/28/2018       Assessment & Plan:   Problem List Items Addressed This Visit       Other   Acute pain of left wrist - Primary    Pain of left wrist not well controlled.  Symptoms present for the past 2 months.  No injuries or trauma associated with current symptoms.  Patient reports lifting weights in the gym, sewing, knitting and cooking a lot.  Advised patient to rest wrist joint, ice, or warm compress as tolerated, brace, anti-inflammatory like ibuprofen or Tylenol as tolerated.  Completed left wrist x-ray results pending.   Follow-up with worsening unresolved symptoms.      Relevant Orders   DG Hand Complete Left     No orders  of the defined types were placed in this encounter.    Ivy Lynn, NP

## 2022-01-22 DIAGNOSIS — H0288A Meibomian gland dysfunction right eye, upper and lower eyelids: Secondary | ICD-10-CM | POA: Diagnosis not present

## 2022-01-22 DIAGNOSIS — H0288B Meibomian gland dysfunction left eye, upper and lower eyelids: Secondary | ICD-10-CM | POA: Diagnosis not present

## 2022-01-22 DIAGNOSIS — H0102A Squamous blepharitis right eye, upper and lower eyelids: Secondary | ICD-10-CM | POA: Diagnosis not present

## 2022-01-22 DIAGNOSIS — H0102B Squamous blepharitis left eye, upper and lower eyelids: Secondary | ICD-10-CM | POA: Diagnosis not present

## 2022-01-22 DIAGNOSIS — H353131 Nonexudative age-related macular degeneration, bilateral, early dry stage: Secondary | ICD-10-CM | POA: Diagnosis not present

## 2022-01-22 DIAGNOSIS — H04123 Dry eye syndrome of bilateral lacrimal glands: Secondary | ICD-10-CM | POA: Diagnosis not present

## 2022-01-22 DIAGNOSIS — H25813 Combined forms of age-related cataract, bilateral: Secondary | ICD-10-CM | POA: Diagnosis not present

## 2022-01-30 IMAGING — MG DIGITAL DIAGNOSTIC BILAT W/ TOMO W/ CAD
8 series · 9 of 24 positions shown · non-contrast
Comparison: Previous exam(s).

CLINICAL DATA: Six-month follow-up of a right breast asymmetry.

EXAM:
DIGITAL DIAGNOSTIC BILATERAL MAMMOGRAM WITH TOMOSYNTHESIS AND CAD
TECHNIQUE: Bilateral digital diagnostic mammography and breast tomosynthesis
was performed. The images were evaluated with computer-aided
detection.

[R CC synth-2D]
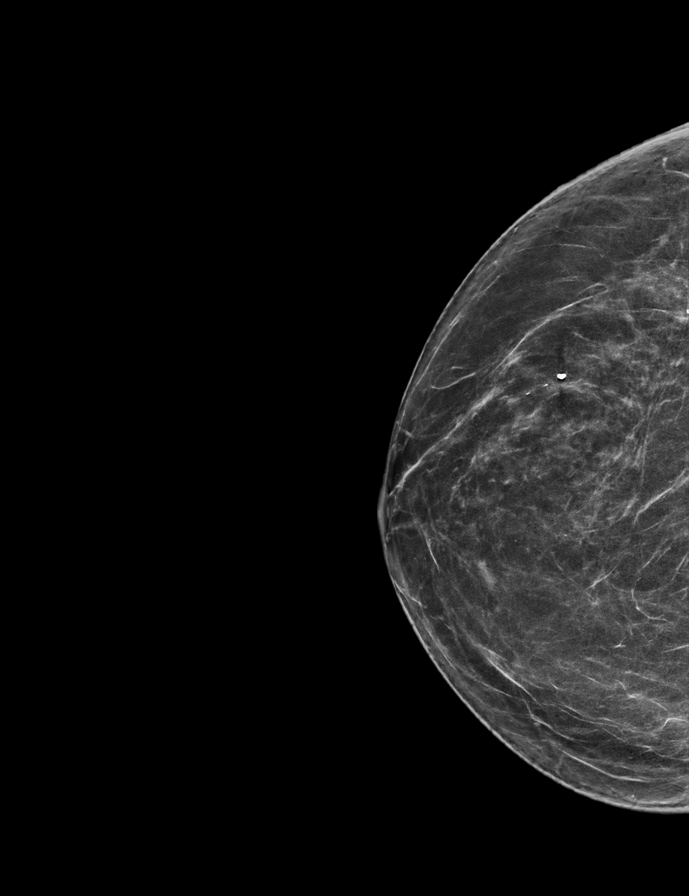

[R MLO synth-2D]
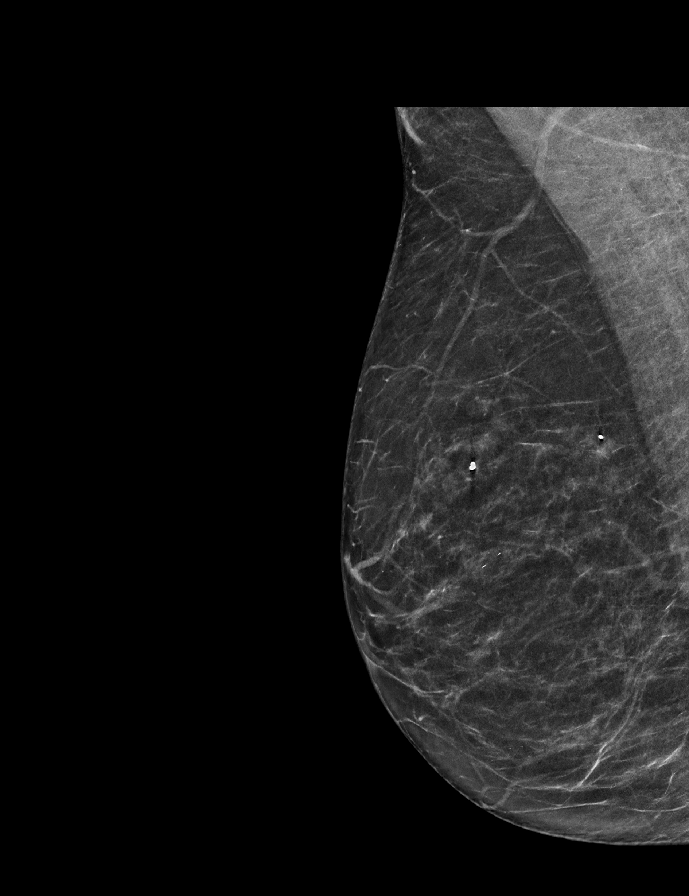

[L MLO synth-2D]
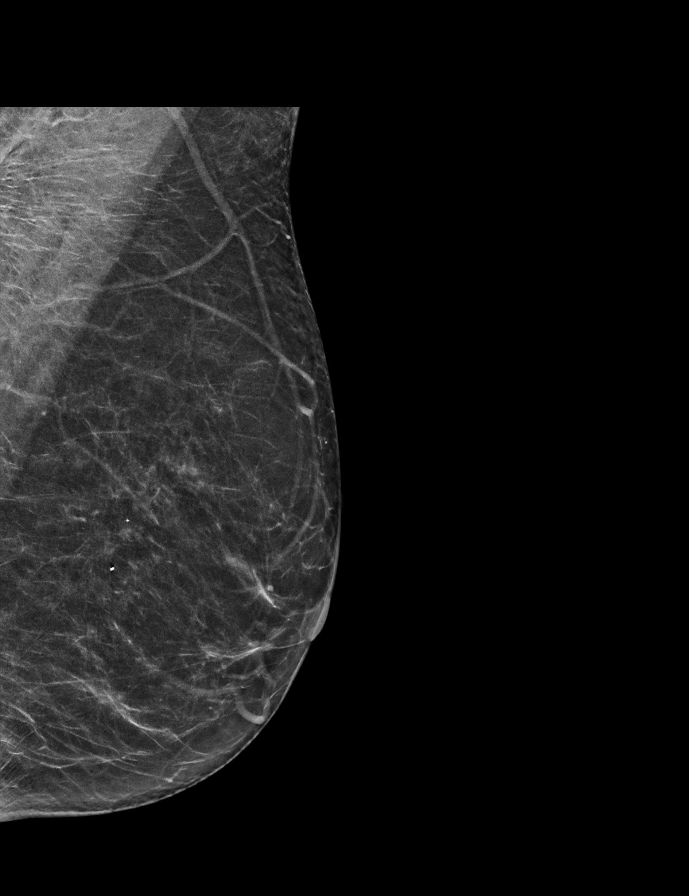

[L CC synth-2D]
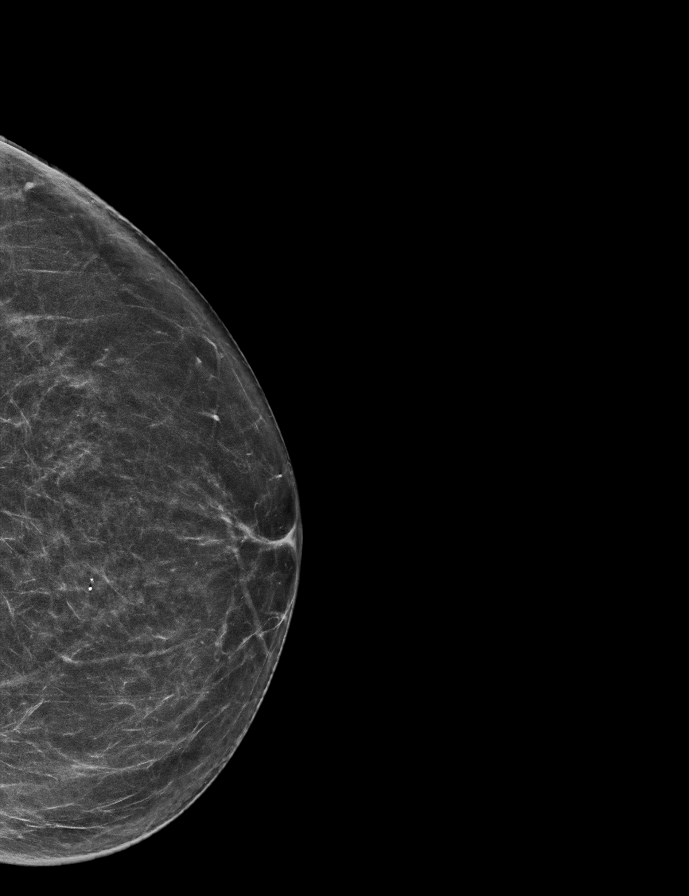

[R MLO tomo · 2 of 62 frames shown]
[frame 21/62]
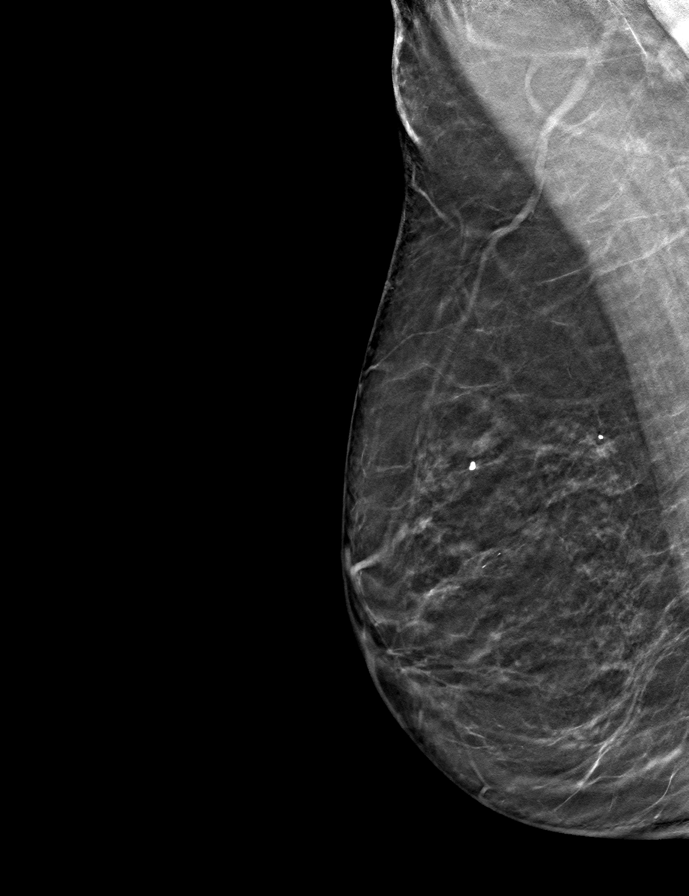
[frame 31/62]
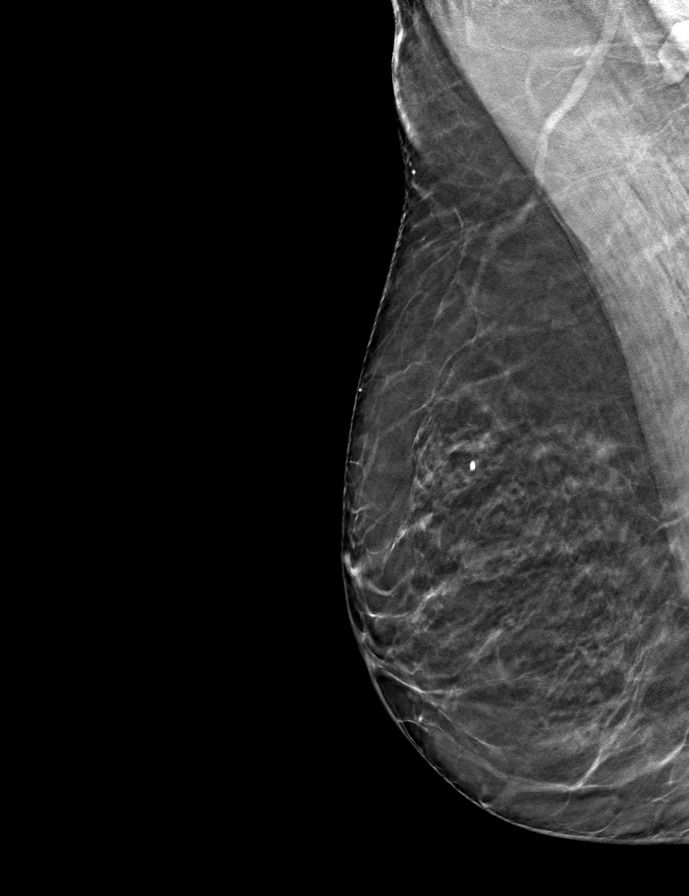

[R CC tomo · tomo slice 33/65.0]
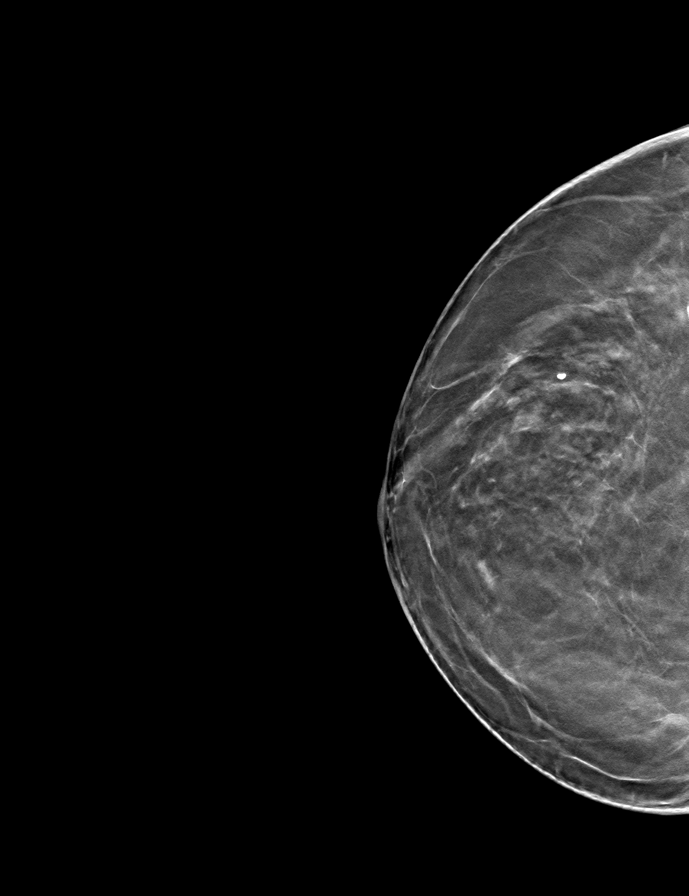

[L CC tomo · tomo slice 31/61.0]
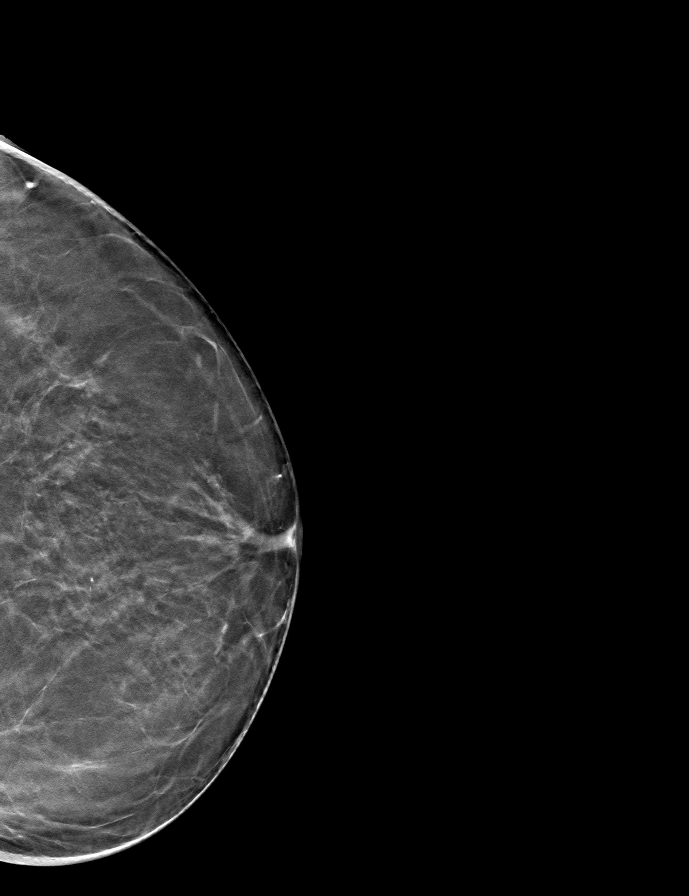

[L MLO tomo · tomo slice 32/63.0]
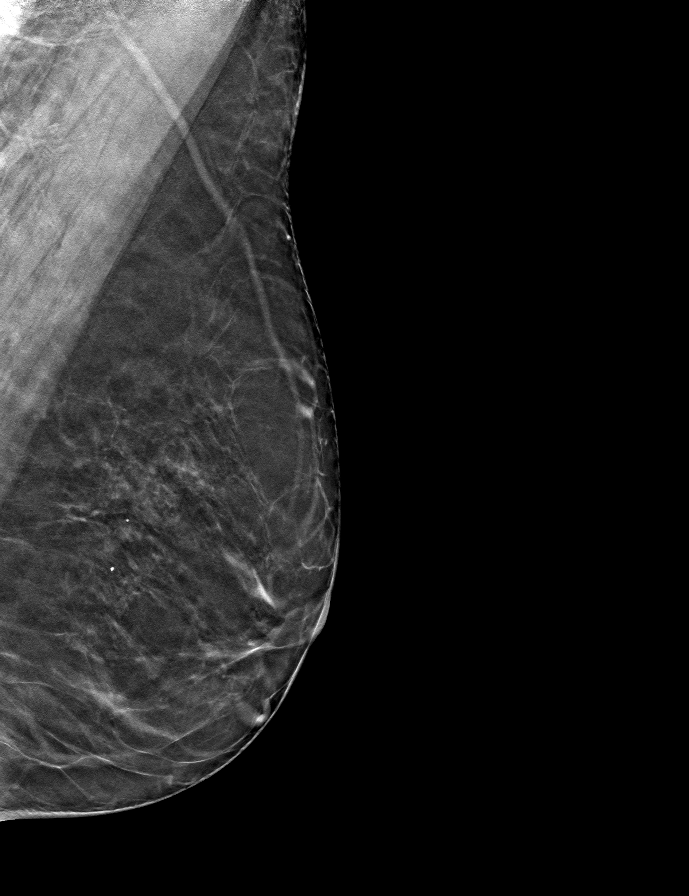

[9 of 24 positions shown; findings below may reference images not displayed]

ACR Breast Density Category b: There are scattered areas of
fibroglandular density.
FINDINGS: The right breast asymmetry resolves on today's imaging. No
suspicious findings in either breast.
IMPRESSION: No mammographic evidence of malignancy.

RECOMMENDATION:
Annual screening mammography.

I have discussed the findings and recommendations with the patient.
If applicable, a reminder letter will be sent to the patient
regarding the next appointment.

BI-RADS CATEGORY  1: Negative.

## 2022-02-02 ENCOUNTER — Other Ambulatory Visit: Payer: Self-pay | Admitting: Nurse Practitioner

## 2022-02-02 DIAGNOSIS — I10 Essential (primary) hypertension: Secondary | ICD-10-CM

## 2022-02-11 ENCOUNTER — Encounter: Payer: Self-pay | Admitting: Nurse Practitioner

## 2022-02-11 ENCOUNTER — Ambulatory Visit: Payer: Medicare PPO | Admitting: Nurse Practitioner

## 2022-02-11 VITALS — BP 120/69 | HR 102 | Temp 98.3°F | Ht 66.0 in | Wt 157.0 lb

## 2022-02-11 DIAGNOSIS — E7800 Pure hypercholesterolemia, unspecified: Secondary | ICD-10-CM

## 2022-02-11 DIAGNOSIS — I480 Paroxysmal atrial fibrillation: Secondary | ICD-10-CM

## 2022-02-11 DIAGNOSIS — I493 Ventricular premature depolarization: Secondary | ICD-10-CM | POA: Diagnosis not present

## 2022-02-11 DIAGNOSIS — Z23 Encounter for immunization: Secondary | ICD-10-CM

## 2022-02-11 DIAGNOSIS — I10 Essential (primary) hypertension: Secondary | ICD-10-CM

## 2022-02-11 DIAGNOSIS — I471 Supraventricular tachycardia, unspecified: Secondary | ICD-10-CM

## 2022-02-11 DIAGNOSIS — M778 Other enthesopathies, not elsewhere classified: Secondary | ICD-10-CM

## 2022-02-11 DIAGNOSIS — Z6826 Body mass index (BMI) 26.0-26.9, adult: Secondary | ICD-10-CM

## 2022-02-11 DIAGNOSIS — F3342 Major depressive disorder, recurrent, in full remission: Secondary | ICD-10-CM | POA: Diagnosis not present

## 2022-02-11 MED ORDER — SERTRALINE HCL 50 MG PO TABS: 50.0000 mg | ORAL_TABLET | Freq: Every day | ORAL | 1 refills | Status: DC

## 2022-02-11 MED ORDER — VALSARTAN-HYDROCHLOROTHIAZIDE 160-12.5 MG PO TABS: 1.0000 | ORAL_TABLET | Freq: Every day | ORAL | 1 refills | Status: DC

## 2022-02-11 MED ORDER — DILTIAZEM HCL ER COATED BEADS 180 MG PO CP24: 180.0000 mg | ORAL_CAPSULE | Freq: Every day | ORAL | 1 refills | Status: DC

## 2022-02-11 MED ORDER — ROSUVASTATIN CALCIUM 20 MG PO TABS: 20.0000 mg | ORAL_TABLET | Freq: Every day | ORAL | 1 refills | Status: DC

## 2022-02-11 MED ORDER — APIXABAN 5 MG PO TABS: 5.0000 mg | ORAL_TABLET | Freq: Two times a day (BID) | ORAL | 6 refills | Status: DC

## 2022-02-11 NOTE — Patient Instructions (Signed)
Tendinitis Tendinitis is irritation and swelling (inflammation) of a tendon. A tendon is a cord of tissue that connects muscle to bone. Tendinitis is most common in the shoulder, ankle, elbow, or wrist. What are the causes? Using a tendon or muscle too much (overuse). This is the most common cause. Wear and tear that happens as you age. Injury. Some medical conditions, such as arthritis. Some medicines. What increases the risk? You are more likely to get this condition if you do activities that involve the same movements over and over again (repetitive motions). What are the signs or symptoms? Pain. Tenderness. Mild swelling. Decreased range of motion. How is this treated? This condition is usually treated with RICE therapy. RICE stands for: Rest. Ice. Compression. This means putting pressure on the affected area. Elevation. This means raising the affected area above the level of your heart. Treatment may also include: Medicines for swelling or pain. Exercises or physical therapy to help your tendon move better and get stronger. A brace or splint. A shot (injection) of a type of medicine called corticosteroid. Surgery. This is rarely needed. Follow these instructions at home: If you have a splint or brace that can be taken off: Wear the splint or brace as told by your doctor. Take it off only as told by your doctor. Check the skin around the splint or brace every day. Tell your doctor if you see problems. Loosen the splint or brace if your fingers or toes: Tingle. Become numb. Turn cold and blue. Keep the splint or brace clean. If the splint or brace is not waterproof: Do not let it get wet. Cover it with a watertight covering when you take a bath or shower, or take it off as told by your doctor. Managing pain, stiffness, and swelling   If told, put ice on the affected area. To do this: If you have a removable splint or brace, take it off as told by your doctor. Put ice in  a plastic bag. Place a towel between your skin and the bag. Leave the ice on for 20 minutes, 2-3 times a day. Take off the ice if your skin turns bright red. This is very important. If you cannot feel pain, heat, or cold, you have a greater risk of damage to the area. Move the fingers or toes of the affected arm or leg often, if this applies. If told, raise the affected area above the level of your heart while you are sitting or lying down. If told, put heat on the affected area before you exercise. Use the heat source that your doctor recommends, such as a moist heat pack or a heating pad. Place a towel between your skin and the heat source. Leave the heat on for 20-30 minutes. Take off the heat if your skin turns bright red. This is very important. If you cannot feel pain, heat, or cold, you have a greater risk of getting burned. Activity Rest the affected area as told by your doctor. Ask your doctor when it is safe to drive if you have a splint or brace on any part of your arm or leg. Return to your normal activities when your doctor says that it is safe. Avoid using the affected area while you have symptoms. Do exercises as told by your doctor. General instructions Wear an elastic bandage or pressure (compression) wrap only as told by your doctor. Take over-the-counter and prescription medicines only as told by your doctor. Keep all follow-up visits. Contact a  doctor if: You do not get better. You get new problems, such as numbness in your hands or feet, and you do not know why. Summary Tendinitis is irritation and swelling (inflammation) of a tendon. You are more likely to get this condition if you do activities that involve the same movements over and over again. This condition is usually treated with RICE therapy. RICE stands for rest, ice, compression, and elevate. Avoid using the affected area while you have symptoms. This information is not intended to replace advice given to  you by your health care provider. Make sure you discuss any questions you have with your health care provider. Document Revised: 08/15/2021 Document Reviewed: 08/15/2021 Elsevier Patient Education  2022 [employer].

## 2022-02-11 NOTE — Addendum Note (Signed)
Addended by: Milas Hock on: 02/11/2022 12:42 PM   Modules accepted: Orders

## 2022-02-11 NOTE — Progress Notes (Signed)
**Note De-Identified Jean Obfuscation** Subjective:    Patient ID: Jean [name], female    DOB: [birth year], 71 y.o.   MRN: [id number]   Chief Complaint: medical management of chronic issues     HPI:  Jean [name] is a 71 y.o. who identifies as a female who was assigned female at birth.   Social history: Lives with: husband Work history: retired   Scientist, forensic in today for follow up of the following chronic medical issues:  1. Primary hypertension No c/o chest pain, sob or headache. Does not check blood pressure at home. BP Readings from Last 3 Encounters:  12/27/21 124/72  08/12/21 130/69  03/15/21 118/68     2. Pure hypercholesterolemia Does try to watch diet. Walks several times a week. Lab Results  Component Value Date   CHOL 132 08/12/2021   HDL 41 08/12/2021   LDLCALC 73 08/12/2021   TRIG 94 08/12/2021   CHOLHDL 3.2 08/12/2021  The 10-year ASCVD risk score (Arnett DK, et al., 2019) is: 11.7%    3. SVT (supraventricular tachycardia) ([place]) Denies heart racing.  4. Paroxysmal atrial fibrillation (HCC) No palpitations or heart racing. Last saw cardiology on 03/15/21. Review of office note showed no changes to plan of care. She is to follow up yearly.  5. PVC's (premature ventricular contractions) No problems  6. Recurrent major depressive disorder, in full remission ([place]) Has been on zoloft for several years. Says she is doing well. Depression screen [facility] 2/9 02/11/2022 12/27/2021 08/12/2021  Decreased Interest 0 0 0  Down, Depressed, Hopeless 0 0 0  PHQ - 2 Score 0 0 0  Altered sleeping 0 0 1  Tired, decreased energy 0 0 0  Change in appetite 0 0 0  Feeling bad or failure about yourself  0 0 0  Trouble concentrating 0 0 0  Moving slowly or fidgety/restless 0 0 0  Suicidal thoughts 0 0 0  PHQ-9 Score 0 0 1  Difficult doing work/chores Not difficult at all Not difficult at all Not difficult at all      7. BMI 26.0-26.9,adult No recent weight changes Wt Readings from Last 3 Encounters:   02/11/22 157 lb (71.2 kg)  12/27/21 158 lb 6 oz (71.8 kg)  08/12/21 158 lb (71.7 kg)   BMI Readings from Last 3 Encounters:  02/11/22 25.34 kg/m  12/27/21 25.56 kg/m  08/12/21 25.50 kg/m     New complaints: Was seen several months ago with left thumb pain and was dx with tendonitis. Has been taking motrin and tylenol  No Known Allergies Outpatient Encounter Medications as of 02/11/2022  Medication Sig   acetaminophen (TYLENOL) 325 MG tablet Take 650 mg by mouth every 6 (six) hours as needed.   apixaban (ELIQUIS) 5 MG TABS tablet Take 1 tablet (5 mg total) by mouth 2 (two) times daily.   diltiazem (CARDIZEM CD) 180 MG 24 hr capsule Take 1 capsule (180 mg total) by mouth daily.   Fexofenadine HCl (ALLEGRA PO) Take 1 tablet by mouth daily as needed.    rosuvastatin (CRESTOR) 20 MG tablet TAKE 1 TABLET BY MOUTH EVERY DAY   sertraline (ZOLOFT) 50 MG tablet TAKE 1 TABLET BY MOUTH EVERY DAY   valsartan-hydrochlorothiazide (DIOVAN-HCT) 160-12.5 MG tablet TAKE 1 TABLET BY MOUTH EVERY DAY   No facility-administered encounter medications on file as of 02/11/2022.    Past Surgical History:  Procedure Laterality Date   BASAL CELL CARCINOMA EXCISION  2021   One on top of head and one on right  arm   US ECHOCARDIOGRAPHY     EF 60%    Family History  Problem Relation Age of Onset   Heart disease Mother    Hypertension Sister    Diabetes Sister    Anemia Paternal Grandmother       Controlled substance contract: n/a     Review of Systems  Constitutional:  Negative for diaphoresis.  Eyes:  Negative for pain.  Respiratory:  Negative for shortness of breath.   Cardiovascular:  Negative for chest pain, palpitations and leg swelling.  Gastrointestinal:  Negative for abdominal pain.  Endocrine: Negative for polydipsia.  Skin:  Negative for rash.  Neurological:  Negative for dizziness, weakness and headaches.  Hematological:  Does not bruise/bleed easily.  All other systems  reviewed and are negative.     Objective:   Physical Exam Vitals and nursing note reviewed.  Constitutional:      General: She is not in acute distress.    Appearance: Normal appearance. She is well-developed.  HENT:     Head: Normocephalic.     Right Ear: Tympanic membrane normal.     Left Ear: Tympanic membrane normal.     Nose: Nose normal.     Mouth/Throat:     Mouth: Mucous membranes are moist.  Eyes:     Pupils: Pupils are equal, round, and reactive to light.  Neck:     Vascular: No carotid bruit or JVD.  Cardiovascular:     Rate and Rhythm: Normal rate and regular rhythm.     Heart sounds: Normal heart sounds.  Pulmonary:     Effort: Pulmonary effort is normal. No respiratory distress.     Breath sounds: Normal breath sounds. No wheezing or rales.  Chest:     Chest wall: No tenderness.  Abdominal:     General: Bowel sounds are normal. There is no distension or abdominal bruit.     Palpations: Abdomen is soft. There is no hepatomegaly, splenomegaly, mass or pulsatile mass.     Tenderness: There is no abdominal tenderness.  Musculoskeletal:        General: Normal range of motion.     Cervical back: Normal range of motion and neck supple.  Lymphadenopathy:     Cervical: No cervical adenopathy.  Skin:    General: Skin is warm and dry.  Neurological:     Mental Status: She is alert and oriented to person, place, and time.     Deep Tendon Reflexes: Reflexes are normal and symmetric.  Psychiatric:        Behavior: Behavior normal.        Thought Content: Thought content normal.        Judgment: Judgment normal.    BP 120/69    Pulse (!) 102    Temp 98.3 F (36.8 C) (Temporal)    Ht _0  (1.676 m)    Wt 157 lb (71.2 kg)    BMI 25.34 kg/m        Assessment & Plan:  Jean [name] comes in today with chief complaint of Medical Management of Chronic Issues and Hypertension   Diagnosis and orders addressed:  1. Primary hypertension Avoid caffeine Keep  follow up with cardiology - CBC with Differential/Platelet - CMP14+EGFR - valsartan-hydrochlorothiazide (DIOVAN-HCT) 160-12.5 MG tablet; Take 1 tablet by mouth daily.  Dispense: 90 tablet; Refill: 1  2. Pure hypercholesterolemia Low fat diet - Lipid panel - rosuvastatin (CRESTOR) 20 MG tablet; Take 1 tablet (20 mg total) by mouth daily.  Dispense: 90 tablet; Refill: 1  3. SVT (supraventricular tachycardia) (HCC) - apixaban (ELIQUIS) 5 MG TABS tablet; Take 1 tablet (5 mg total) by mouth 2 (two) times daily.  Dispense: 60 tablet; Refill: 6 - diltiazem (CARDIZEM CD) 180 MG 24 hr capsule; Take 1 capsule (180 mg total) by mouth daily.  Dispense: 90 capsule; Refill: 1  4. Paroxysmal atrial fibrillation (HCC)  5. PVC's (premature ventricular contractions)  6. Recurrent major depressive disorder, in full remission ([place]) Stress management - sertraline (ZOLOFT) 50 MG tablet; Take 1 tablet (50 mg total) by mouth daily.  Dispense: 90 tablet; Refill: 1  7. BMI 26.0-26.9,adult Discussed diet and exercise for person with BMI >25 Will recheck weight in 3-6 months   8. Thumb tendonitis Wear thumb spica splint   Labs pending Health Maintenance reviewed Diet and exercise encouraged  Follow up plan: 6 months   Mary-Margaret Hassell Done, FNP Low sodium diet

## 2022-02-12 ENCOUNTER — Ambulatory Visit: Payer: Medicare PPO | Admitting: Nurse Practitioner

## 2022-02-12 LAB — CBC WITH DIFFERENTIAL/PLATELET
Basophils Absolute: 0 10*3/uL (ref 0.0–0.2)
Basos: 0 %
EOS (ABSOLUTE): 0.1 10*3/uL (ref 0.0–0.4)
Eos: 2 %
Hematocrit: 41 % (ref 34.0–46.6)
Hemoglobin: 14.4 g/dL (ref 11.1–15.9)
Immature Grans (Abs): 0 10*3/uL (ref 0.0–0.1)
Immature Granulocytes: 0 %
Lymphocytes Absolute: 0.9 10*3/uL (ref 0.7–3.1)
Lymphs: 16 %
MCH: 30.8 pg (ref 26.6–33.0)
MCHC: 35.1 g/dL (ref 31.5–35.7)
MCV: 88 fL (ref 79–97)
Monocytes Absolute: 0.6 10*3/uL (ref 0.1–0.9)
Monocytes: 11 %
Neutrophils Absolute: 3.9 10*3/uL (ref 1.4–7.0)
Neutrophils: 71 %
Platelets: 126 10*3/uL — ABNORMAL LOW (ref 150–450)
RBC: 4.67 x10E6/uL (ref 3.77–5.28)
RDW: 12.5 % (ref 11.7–15.4)
WBC: 5.4 10*3/uL (ref 3.4–10.8)

## 2022-02-12 LAB — CMP14+EGFR
ALT: 22 IU/L (ref 0–32)
AST: 18 IU/L (ref 0–40)
Albumin/Globulin Ratio: 1.6 (ref 1.2–2.2)
Albumin: 4.4 g/dL (ref 3.7–4.7)
Alkaline Phosphatase: 105 IU/L (ref 44–121)
BUN/Creatinine Ratio: 22 (ref 12–28)
BUN: 16 mg/dL (ref 8–27)
Bilirubin Total: 0.9 mg/dL (ref 0.0–1.2)
CO2: 24 mmol/L (ref 20–29)
Calcium: 9.4 mg/dL (ref 8.7–10.3)
Chloride: 99 mmol/L (ref 96–106)
Creatinine, Ser: 0.73 mg/dL (ref 0.57–1.00)
Globulin, Total: 2.7 g/dL (ref 1.5–4.5)
Glucose: 115 mg/dL — ABNORMAL HIGH (ref 70–99)
Potassium: 4 mmol/L (ref 3.5–5.2)
Sodium: 139 mmol/L (ref 134–144)
Total Protein: 7.1 g/dL (ref 6.0–8.5)
eGFR: 88 mL/min/{1.73_m2} (ref >59)

## 2022-02-12 LAB — LIPID PANEL
Chol/HDL Ratio: 3 ratio (ref 0.0–4.4)
Cholesterol, Total: 140 mg/dL (ref 100–199)
HDL: 47 mg/dL (ref >39)
LDL Chol Calc (NIH): 78 mg/dL (ref 0–99)
Triglycerides: 78 mg/dL (ref 0–149)
VLDL Cholesterol Cal: 15 mg/dL (ref 5–40)

## 2022-02-20 ENCOUNTER — Encounter: Payer: Self-pay | Admitting: Family Medicine

## 2022-02-20 ENCOUNTER — Ambulatory Visit: Payer: Medicare PPO | Admitting: Family Medicine

## 2022-02-20 DIAGNOSIS — L282 Other prurigo: Secondary | ICD-10-CM

## 2022-02-20 MED ORDER — PREDNISONE 10 MG (21) PO TBPK: ORAL_TABLET | ORAL | 0 refills | Status: DC

## 2022-02-20 NOTE — Progress Notes (Signed)
? ?Virtual Visit via telephone Note ?Due to COVID-19 pandemic this visit was conducted virtually. This visit type was conducted due to national recommendations for restrictions regarding the COVID-19 Pandemic (e.g. social distancing, sheltering in place) in an effort to limit this patient's exposure and mitigate transmission in our community. All issues noted in this document were discussed and addressed.  A physical exam was not performed with this format.  ? ?I connected with Jean [name] on 02/20/2022 at 1350 by telephone and verified that I am speaking with the correct person using two identifiers. Jean [name] is currently located at home and patient is currently with them during visit. The provider, Jean Pouch, FNP is located in their office at time of visit. ? ?I discussed the limitations, risks, security and privacy concerns of performing an evaluation and management service by virtual visit and the availability of in person appointments. I also discussed with the patient that there may be a patient responsible charge related to this service. The patient expressed understanding and agreed to proceed. ? ?Subjective:  ?Patient ID: Jean [name], female    DOB: 30-Jun-1951, 71 y.o.   MRN: [id number] ? ?Chief Complaint:  Rash ? ? ?HPI: ?Jean [name] is a 71 y.o. female presenting on 02/20/2022 for Rash ? ? ?Rash ?This is a new problem. The current episode started in the past 7 days. The problem has been waxing and waning since onset. The affected locations include the left lower leg, left upper leg, right upper leg, right lower leg, left ankle and right ankle. The rash is characterized by redness, itchiness and blistering. She was exposed to plant contact. Pertinent negatives include no anorexia, congestion, cough, diarrhea, eye pain, facial edema, fatigue, fever, joint pain, nail changes, rhinorrhea, shortness of breath, sore throat or vomiting. Past treatments include anti-itch cream and  antihistamine. The treatment provided no relief.  ? ? ?Relevant past medical, surgical, family, and social history reviewed and updated as indicated.  ?Allergies and medications reviewed and updated. ? ? ?Past Medical History:  ?Diagnosis Date  ? Gestational diabetes   ? WITH HER CHILDBIRTH  ? Hyperlipidemia   ? Hypertension   ? Mitral insufficiency   ? TRIVIAL  ? Palpitations   ? PVC's (premature ventricular contractions) 03/16/2014  ? SVT (supraventricular tachycardia) ([place])   ? ? ?Past Surgical History:  ?Procedure Laterality Date  ? BASAL CELL CARCINOMA EXCISION  2021  ? One on top of head and one on right arm  ? US ECHOCARDIOGRAPHY    ? EF 60%  ? ? ?Social History  ? ?Socioeconomic History  ? Marital status: Married  ?  Spouse name: Not on file  ? Number of children: 1  ? Years of education: Not on file  ? Highest education level: Not on file  ?Occupational History  ? Occupation: Licensed conveyancer  ?  Comment: retired  ?Tobacco Use  ? Smoking status: Never  ? Smokeless tobacco: Never  ?Vaping Use  ? Vaping Use: Never used  ?Substance and Sexual Activity  ? Alcohol use: No  ? Drug use: No  ? Sexual activity: Not on file  ?Other Topics Concern  ? Not on file  ?Social History Narrative  ? Not on file  ? ?Social Determinants of Health  ? ?Financial Resource Strain: Not on file  ?Food Insecurity: Not on file  ?Transportation Needs: Not on file  ?Physical Activity: Not on file  ?Stress: Not on file  ?Social Connections: Not on file  ?  Intimate Partner Violence: Not on file  ? ? ?Outpatient Encounter Medications as of 02/20/2022  ?Medication Sig  ? predniSONE (STERAPRED UNI-PAK 21 TAB) 10 MG (21) TBPK tablet Use as directed on back of pill pack  ? acetaminophen (TYLENOL) 325 MG tablet Take 650 mg by mouth every 6 (six) hours as needed.  ? apixaban (ELIQUIS) 5 MG TABS tablet Take 1 tablet (5 mg total) by mouth 2 (two) times daily.  ? diltiazem (CARDIZEM CD) 180 MG 24 hr capsule Take 1 capsule (180 mg total) by mouth daily.  ?  Fexofenadine HCl (ALLEGRA PO) Take 1 tablet by mouth daily as needed.   ? Multiple Vitamins-Minerals (PRESERVISION AREDS PO) Take by mouth.  ? rosuvastatin (CRESTOR) 20 MG tablet Take 1 tablet (20 mg total) by mouth daily.  ? sertraline (ZOLOFT) 50 MG tablet Take 1 tablet (50 mg total) by mouth daily.  ? valsartan-hydrochlorothiazide (DIOVAN-HCT) 160-12.5 MG tablet Take 1 tablet by mouth daily.  ? ?No facility-administered encounter medications on file as of 02/20/2022.  ? ? ?No Known Allergies ? ?Review of Systems  ?Constitutional:  Negative for activity change, appetite change, chills, diaphoresis, fatigue, fever and unexpected weight change.  ?HENT:  Negative for congestion, rhinorrhea, sore throat, trouble swallowing and voice change.   ?Eyes:  Negative for photophobia, pain and visual disturbance.  ?Respiratory:  Negative for apnea, cough, choking, chest tightness, shortness of breath, wheezing and stridor.   ?Cardiovascular:  Negative for chest pain, palpitations and leg swelling.  ?Gastrointestinal:  Negative for abdominal pain, anorexia, diarrhea and vomiting.  ?Genitourinary:  Negative for decreased urine volume and difficulty urinating.  ?Musculoskeletal:  Negative for joint pain.  ?Skin:  Positive for color change and rash. Negative for nail changes, pallor and wound.  ?Neurological:  Negative for dizziness, tremors, seizures, syncope, facial asymmetry, speech difficulty, weakness, light-headedness, numbness and headaches.  ?Psychiatric/Behavioral:  Negative for confusion.   ? ?   ? ? ?Observations/Objective: ?No vital signs or physical exam, this was a virtual health encounter.  ?Pt alert and oriented, answers all questions appropriately, and able to speak in full sentences.  ? ? ?Assessment and Plan: ?Jean [name] was seen today for rash. ? ?Diagnoses and all orders for this visit: ? ?Pruritic rash ?Pruritic rash with know exposure to poison ivy plant. Continue topical therapy. Will treat with oral steroids as  this has been present for over a week. Pt aware to report any new, worsening, or persistent symptoms.  ?-     predniSONE (STERAPRED UNI-PAK 21 TAB) 10 MG (21) TBPK tablet; Use as directed on back of pill pack ? ? ? ? ?Follow Up Instructions: ?Return if symptoms worsen or fail to improve. ? ?  ?I discussed the assessment and treatment plan with the patient. The patient was provided an opportunity to ask questions and all were answered. The patient agreed with the plan and demonstrated an understanding of the instructions. ?  ?The patient was advised to call back or seek an in-person evaluation if the symptoms worsen or if the condition fails to improve as anticipated. ? ?The above assessment and management plan was discussed with the patient. The patient verbalized understanding of and has agreed to the management plan. Patient is aware to call the clinic if they develop any new symptoms or if symptoms persist or worsen. Patient is aware when to return to the clinic for a follow-up visit. Patient educated on when it is appropriate to go to the emergency department.  ? ? ?  I provided 15 minutes of time during this telephone encounter. ? ? ?Jean Pouch, FNP-C ?[facility] ?[address] ?[place], [place] [address] ?[phone] ?02/20/2022 ? ? ?

## 2022-03-20 DIAGNOSIS — H2512 Age-related nuclear cataract, left eye: Secondary | ICD-10-CM | POA: Diagnosis not present

## 2022-03-30 DIAGNOSIS — H2511 Age-related nuclear cataract, right eye: Secondary | ICD-10-CM | POA: Diagnosis not present

## 2022-04-02 ENCOUNTER — Telehealth: Payer: Self-pay | Admitting: Nurse Practitioner

## 2022-04-02 DIAGNOSIS — M779 Enthesopathy, unspecified: Secondary | ICD-10-CM

## 2022-04-02 NOTE — Telephone Encounter (Signed)
REFERRAL REQUEST ?Telephone Note ? ?Have you been seen at our office for this problem? yes ?(Advise that they may need an appointment with their PCP before a referral can be done) ? ?Reason for Referral: tendonitis ?Referral discussed with patient: yes  ?Best contact number of patient for referral team: [phone]    ?Has patient been seen by a specialist for this issue before: no  ?Patient provider preference for referral: [facility] ?Patient location preference for referral: [place]  ?  ?Patient notified that referrals can take up to a week or longer to process. If they haven't heard anything within a week they should call back and speak with the referral department.  ? ?

## 2022-04-03 DIAGNOSIS — H2511 Age-related nuclear cataract, right eye: Secondary | ICD-10-CM | POA: Diagnosis not present

## 2022-04-05 NOTE — Progress Notes (Signed)
? ?Jean [name] ?Date of Birth: April 05, 1951 ? ? ?History of Present Illness: ?[name] is seen today for yearly followup. She has a history of SVT in the past and PVCs. Echo in 2012 showed normal LV function. Event monitor then showed predominantly PVCs with only a 4 beat run of PAT.  ? ?She was seen by Almyra Deforest PA-C for a televisit in May with complaints of increased palpitations. Zio monitor was placed. This demonstrated Afib with RVR rate ranging from 64-196. Burden 11%. Average HR 124. Nonsustained runs of atrial tachycardia. Metoprolol increased to 25 mg bid. Started on Eliquis and diltiazem. ASA discontinued. Echo showed normal EF with mild AI and mild LVH.  ? ?On follow up today she states she still notices irregular rhythms but rate is controlled. She thinks she may be in aFib most of the time. It doesn't really bother her. Minor palpitations. She denies any chest pain, dizziness, syncope, SOB. Tolerating medication well.  ? ?Current Outpatient Medications on File Prior to Visit  ?Medication Sig Dispense Refill  ? acetaminophen (TYLENOL) 325 MG tablet Take 650 mg by mouth every 6 (six) hours as needed.    ? apixaban (ELIQUIS) 5 MG TABS tablet Take 1 tablet (5 mg total) by mouth 2 (two) times daily. 60 tablet 6  ? diltiazem (CARDIZEM CD) 180 MG 24 hr capsule Take 1 capsule (180 mg total) by mouth daily. 90 capsule 1  ? Fexofenadine HCl (ALLEGRA PO) Take 1 tablet by mouth daily as needed.     ? Multiple Vitamins-Minerals (PRESERVISION AREDS 2) CAPS SMARTSIG:1 Tablet(s) By Mouth    ? Multiple Vitamins-Minerals (PRESERVISION AREDS PO) Take by mouth.    ? Prednisol Ace-Moxiflox-Bromfen 1-0.5-0.075 % SUSP Place 1 drop into the left eye 4 (four) times daily.    ? rosuvastatin (CRESTOR) 20 MG tablet Take 1 tablet (20 mg total) by mouth daily. 90 tablet 1  ? sertraline (ZOLOFT) 50 MG tablet Take 1 tablet (50 mg total) by mouth daily. 90 tablet 1  ? valsartan-hydrochlorothiazide (DIOVAN-HCT) 160-12.5 MG tablet  Take 1 tablet by mouth daily. 90 tablet 1  ? ?No current facility-administered medications on file prior to visit.  ? ? ?No Known Allergies ? ?Past Medical History:  ?Diagnosis Date  ? Gestational diabetes   ? WITH HER CHILDBIRTH  ? Hyperlipidemia   ? Hypertension   ? Mitral insufficiency   ? TRIVIAL  ? Palpitations   ? PVC's (premature ventricular contractions) 03/16/2014  ? SVT (supraventricular tachycardia) ([place])   ? ? ?Past Surgical History:  ?Procedure Laterality Date  ? BASAL CELL CARCINOMA EXCISION  2021  ? One on top of head and one on right arm  ? US ECHOCARDIOGRAPHY    ? EF 60%  ? ? ?Social History  ? ?Tobacco Use  ?Smoking Status Never  ?Smokeless Tobacco Never  ? ? ?Social History  ? ?Substance and Sexual Activity  ?Alcohol Use No  ? ? ?Family History  ?Problem Relation Age of Onset  ? Heart disease Mother   ? Hypertension Sister   ? Diabetes Sister   ? Anemia Paternal Grandmother   ? ? ?Review of Systems: ?As noted in HPI. All other systems were reviewed and are negative. ? ?Physical Exam: ?BP 130/82   Pulse 94   Ht '5\' 6"'$  (1.676 m)   Wt 157 lb (71.2 kg)   SpO2 94%   BMI 25.34 kg/m?  ?GENERAL:  Well appearing WF in NAD ?HEENT:  PERRL, EOMI, sclera are  clear. Oropharynx is clear. ?NECK:  No jugular venous distention, carotid upstroke brisk and symmetric, no bruits, no thyromegaly or adenopathy ?LUNGS:  Clear to auscultation bilaterally ?CHEST:  Unremarkable ?HEART:  IRRR,  PMI not displaced or sustained,S1 and S2 within normal limits, no S3, no S4: no clicks, no rubs, no murmurs ?ABD:  Soft, nontender. BS +, no masses or bruits. No hepatomegaly, no splenomegaly ?EXT:  2 + pulses throughout, no edema, no cyanosis no clubbing ?SKIN:  Warm and dry.  No rashes ?NEURO:  Alert and oriented x 3. Cranial nerves II through XII intact. ?PSYCH:  Cognitively intact ? ? ? ?LABORATORY DATA: ? ?Lab Results  ?Component Value Date  ? WBC 5.4 02/11/2022  ? HGB 14.4 02/11/2022  ? HCT 41.0 02/11/2022  ? PLT 126 (L)  02/11/2022  ? GLUCOSE 115 (H) 02/11/2022  ? CHOL 140 02/11/2022  ? TRIG 78 02/11/2022  ? HDL 47 02/11/2022  ? [place] 78 02/11/2022  ? ALT 22 02/11/2022  ? AST 18 02/11/2022  ? NA 139 02/11/2022  ? K 4.0 02/11/2022  ? CL 99 02/11/2022  ? CREATININE 0.73 02/11/2022  ? BUN 16 02/11/2022  ? CO2 24 02/11/2022  ? TSH 1.530 05/12/2019  ? HGBA1C 5.7 10/28/2018  ? ? ?Ecg today shows Afib  rate 94. Nonspecific ST abnormality.  I have personally reviewed and interpreted this study. ? ?Echo 07/04/19: IMPRESSIONS ?  ?  ? 1. The left ventricle has normal systolic function with an ejection fraction of 60-65%. The cavity size was normal. There is mildly increased left ventricular wall thickness. Left ventricular diastolic Doppler parameters are indeterminate. ? 2. The right ventricle has normal systolic function. The cavity was normal. There is no increase in right ventricular wall thickness. ? 3. The aortic valve is tricuspid. Mild thickening of the aortic valve. Aortic valve regurgitation is mild by color flow Doppler. ? ?Zio monitor: 06/28/19: Study Highlights ? ?NSR ?Atrial fibrillation with RVR. Burden 11%. HR range 64-196 with average 124 bpm ?Nonsustained runs of atrial tachycardia.  ? ? ? ?Assessment / Plan: ?1. Paroxysmal Afib with RVR. Now on Eliquis. [place] vasc score of 3. Is in Afib today.  Doing well on diltizazem. She is really having no significant symptoms with Afib so I would continue rate control strategy. Follow up in one year.  ? ?2.  Hypertension, well controlled  ? ?3. Hyperlipidemia. On Crestor. LDL 78 ? ?Follow up in one year ?

## 2022-04-07 NOTE — Telephone Encounter (Signed)
[place] for [facility] referral ?

## 2022-04-08 ENCOUNTER — Telehealth: Payer: Self-pay | Admitting: Nurse Practitioner

## 2022-04-09 NOTE — Telephone Encounter (Signed)
[facility] up front to pick up - patient notified  ?

## 2022-04-10 ENCOUNTER — Encounter: Payer: Self-pay | Admitting: Cardiology

## 2022-04-10 ENCOUNTER — Ambulatory Visit: Payer: Medicare PPO | Admitting: Cardiology

## 2022-04-10 VITALS — BP 130/82 | HR 94 | Ht 66.0 in | Wt 157.0 lb

## 2022-04-10 DIAGNOSIS — E785 Hyperlipidemia, unspecified: Secondary | ICD-10-CM

## 2022-04-10 DIAGNOSIS — I480 Paroxysmal atrial fibrillation: Secondary | ICD-10-CM | POA: Diagnosis not present

## 2022-04-10 DIAGNOSIS — I10 Essential (primary) hypertension: Secondary | ICD-10-CM | POA: Diagnosis not present

## 2022-04-21 DIAGNOSIS — M654 Radial styloid tenosynovitis [de Quervain]: Secondary | ICD-10-CM | POA: Diagnosis not present

## 2022-04-22 DIAGNOSIS — Z85828 Personal history of other malignant neoplasm of skin: Secondary | ICD-10-CM | POA: Diagnosis not present

## 2022-04-22 DIAGNOSIS — L579 Skin changes due to chronic exposure to nonionizing radiation, unspecified: Secondary | ICD-10-CM | POA: Diagnosis not present

## 2022-04-22 DIAGNOSIS — D235 Other benign neoplasm of skin of trunk: Secondary | ICD-10-CM | POA: Diagnosis not present

## 2022-04-22 DIAGNOSIS — L814 Other melanin hyperpigmentation: Secondary | ICD-10-CM | POA: Diagnosis not present

## 2022-04-22 DIAGNOSIS — L821 Other seborrheic keratosis: Secondary | ICD-10-CM | POA: Diagnosis not present

## 2022-08-11 ENCOUNTER — Encounter: Payer: Self-pay | Admitting: Nurse Practitioner

## 2022-08-11 ENCOUNTER — Ambulatory Visit: Payer: Medicare PPO | Admitting: Nurse Practitioner

## 2022-08-11 VITALS — BP 127/82 | HR 92 | Temp 98.0°F | Ht 66.0 in | Wt 158.4 lb

## 2022-08-11 DIAGNOSIS — I480 Paroxysmal atrial fibrillation: Secondary | ICD-10-CM | POA: Diagnosis not present

## 2022-08-11 DIAGNOSIS — F3342 Major depressive disorder, recurrent, in full remission: Secondary | ICD-10-CM | POA: Diagnosis not present

## 2022-08-11 DIAGNOSIS — E7800 Pure hypercholesterolemia, unspecified: Secondary | ICD-10-CM | POA: Diagnosis not present

## 2022-08-11 DIAGNOSIS — I10 Essential (primary) hypertension: Secondary | ICD-10-CM

## 2022-08-11 DIAGNOSIS — Z6826 Body mass index (BMI) 26.0-26.9, adult: Secondary | ICD-10-CM

## 2022-08-11 DIAGNOSIS — I471 Supraventricular tachycardia: Secondary | ICD-10-CM

## 2022-08-11 MED ORDER — VALSARTAN-HYDROCHLOROTHIAZIDE 160-12.5 MG PO TABS: 1.0000 | ORAL_TABLET | Freq: Every day | ORAL | 1 refills | Status: DC

## 2022-08-11 MED ORDER — DILTIAZEM HCL ER COATED BEADS 180 MG PO CP24: 180.0000 mg | ORAL_CAPSULE | Freq: Every day | ORAL | 1 refills | Status: DC

## 2022-08-11 MED ORDER — SERTRALINE HCL 50 MG PO TABS: 50.0000 mg | ORAL_TABLET | Freq: Every day | ORAL | 1 refills | Status: DC

## 2022-08-11 MED ORDER — APIXABAN 5 MG PO TABS: 5.0000 mg | ORAL_TABLET | Freq: Two times a day (BID) | ORAL | 6 refills | Status: DC

## 2022-08-11 MED ORDER — ROSUVASTATIN CALCIUM 20 MG PO TABS: 20.0000 mg | ORAL_TABLET | Freq: Every day | ORAL | 1 refills | Status: DC

## 2022-08-11 NOTE — Progress Notes (Signed)
Subjective:    Patient ID: Jean [name], female    DOB: 09-06-1951, 71 y.o.   MRN: [id number]   Chief Complaint: medical management of chronic issues     HPI:  Jean [name] is a 71 y.o. who identifies as a female who was assigned female at birth.   Social history: Lives with: husband Work history: retired   25. Primary hypertension No c/o chest pain, sob or headache. Does not check blood pressure at home. BP Readings from Last 3 Encounters:  04/10/22 130/82  02/11/22 120/69  12/27/21 124/72     2. Paroxysmal atrial fibrillation (HCC) 3. SVT (supraventricular tachycardia) (HCC) No c/o palpitations or heart racing since last visit  4. Pure hypercholesterolemia Does try to watch diet and stays very active. Lab Results  Component Value Date   CHOL 140 02/11/2022   HDL 47 02/11/2022   LDLCALC 78 02/11/2022   TRIG 78 02/11/2022   CHOLHDL 3.0 02/11/2022   The 10-year ASCVD risk score (Arnett DK, et al., 2019) is: 14.4%   5. Recurrent major depressive disorder, in full remission ([place]) Is on zoloft and is doing well.    08/11/2022    8:01 AM 02/11/2022    8:59 AM 12/27/2021   11:56 AM  Depression screen PHQ 2/9  Decreased Interest 0 0 0  Down, Depressed, Hopeless 0 0 0  PHQ - 2 Score 0 0 0  Altered sleeping 1 0 0  Tired, decreased energy 1 0 0  Change in appetite 0 0 0  Feeling bad or failure about yourself  0 0 0  Trouble concentrating 0 0 0  Moving slowly or fidgety/restless 0 0 0  Suicidal thoughts 0 0 0  PHQ-9 Score 2 0 0  Difficult doing work/chores Not difficult at all Not difficult at all Not difficult at all     6. BMI 26.0-26.9,adult No recent weight changes Wt Readings from Last 3 Encounters:  08/11/22 158 lb 6.4 oz (71.8 kg)  04/10/22 157 lb (71.2 kg)  02/11/22 157 lb (71.2 kg)   BMI Readings from Last 3 Encounters:  08/11/22 25.57 kg/m  04/10/22 25.34 kg/m  02/11/22 25.34 kg/m     New complaints: None today  No Known  Allergies Outpatient Encounter Medications as of 08/11/2022  Medication Sig   acetaminophen (TYLENOL) 325 MG tablet Take 650 mg by mouth every 6 (six) hours as needed.   apixaban (ELIQUIS) 5 MG TABS tablet Take 1 tablet (5 mg total) by mouth 2 (two) times daily.   diltiazem (CARDIZEM CD) 180 MG 24 hr capsule Take 1 capsule (180 mg total) by mouth daily.   Fexofenadine HCl (ALLEGRA PO) Take 1 tablet by mouth daily as needed.    Multiple Vitamins-Minerals (PRESERVISION AREDS 2) CAPS SMARTSIG:1 Tablet(s) By Mouth   Multiple Vitamins-Minerals (PRESERVISION AREDS PO) Take by mouth.   Prednisol Ace-Moxiflox-Bromfen 1-0.5-0.075 % SUSP Place 1 drop into the left eye 4 (four) times daily.   rosuvastatin (CRESTOR) 20 MG tablet Take 1 tablet (20 mg total) by mouth daily.   sertraline (ZOLOFT) 50 MG tablet Take 1 tablet (50 mg total) by mouth daily.   valsartan-hydrochlorothiazide (DIOVAN-HCT) 160-12.5 MG tablet Take 1 tablet by mouth daily.   No facility-administered encounter medications on file as of 08/11/2022.    Past Surgical History:  Procedure Laterality Date   BASAL CELL CARCINOMA EXCISION  2021   One on top of head and one on right arm   US ECHOCARDIOGRAPHY  EF 60%    Family History  Problem Relation Age of Onset   Heart disease Mother    Hypertension Sister    Diabetes Sister    Anemia Paternal Grandmother       Controlled substance contract: n/a     Review of Systems  Constitutional:  Negative for diaphoresis.  Eyes:  Negative for pain.  Respiratory:  Negative for shortness of breath.   Cardiovascular:  Negative for chest pain, palpitations and leg swelling.  Gastrointestinal:  Negative for abdominal pain.  Endocrine: Negative for polydipsia.  Skin:  Negative for rash.  Neurological:  Negative for dizziness, weakness and headaches.  Hematological:  Does not bruise/bleed easily.  All other systems reviewed and are negative.      Objective:   Physical  Exam Vitals and nursing note reviewed.  Constitutional:      General: She is not in acute distress.    Appearance: Normal appearance. She is well-developed.  HENT:     Head: Normocephalic.     Right Ear: Tympanic membrane normal.     Left Ear: Tympanic membrane normal.     Nose: Nose normal.     Mouth/Throat:     Mouth: Mucous membranes are moist.  Eyes:     Pupils: Pupils are equal, round, and reactive to light.  Neck:     Vascular: No carotid bruit or JVD.  Cardiovascular:     Rate and Rhythm: Normal rate and regular rhythm.     Heart sounds: Normal heart sounds.  Pulmonary:     Effort: Pulmonary effort is normal. No respiratory distress.     Breath sounds: Normal breath sounds. No wheezing or rales.  Chest:     Chest wall: No tenderness.  Abdominal:     General: Bowel sounds are normal. There is no distension or abdominal bruit.     Palpations: Abdomen is soft. There is no hepatomegaly, splenomegaly, mass or pulsatile mass.     Tenderness: There is no abdominal tenderness.  Musculoskeletal:        General: Normal range of motion.     Cervical back: Normal range of motion and neck supple.  Lymphadenopathy:     Cervical: No cervical adenopathy.  Skin:    General: Skin is warm and dry.  Neurological:     Mental Status: She is alert and oriented to person, place, and time.     Deep Tendon Reflexes: Reflexes are normal and symmetric.  Psychiatric:        Behavior: Behavior normal.        Thought Content: Thought content normal.        Judgment: Judgment normal.    BP 127/82   Pulse 92   Temp 98 F (36.7 C) (Temporal)   Ht 5' 6" (1.676 m)   Wt 158 lb 6.4 oz (71.8 kg)   SpO2 97%   BMI 25.57 kg/m         Assessment & Plan:  Jean [name] comes in today with chief complaint of Hypertension   Diagnosis and orders addressed:  1. Primary hypertension Low sodium diet - CBC with Differential/Platelet - CMP14+EGFR - valsartan-hydrochlorothiazide (DIOVAN-HCT)  160-12.5 MG tablet; Take 1 tablet by mouth daily.  Dispense: 90 tablet; Refill: 1  2. Paroxysmal atrial fibrillation (HCC) Avoid caffeine  3. SVT (supraventricular tachycardia) (HCC) Avoid caffeine - apixaban (ELIQUIS) 5 MG TABS tablet; Take 1 tablet (5 mg total) by mouth 2 (two) times daily.  Dispense: 60 tablet; Refill: 6 -  diltiazem (CARDIZEM CD) 180 MG 24 hr capsule; Take 1 capsule (180 mg total) by mouth daily.  Dispense: 90 capsule; Refill: 1  4. Pure hypercholesterolemia Low fat diet - Lipid panel - rosuvastatin (CRESTOR) 20 MG tablet; Take 1 tablet (20 mg total) by mouth daily.  Dispense: 90 tablet; Refill: 1  5. Recurrent major depressive disorder, in full remission ([place]) Stress management - sertraline (ZOLOFT) 50 MG tablet; Take 1 tablet (50 mg total) by mouth daily.  Dispense: 90 tablet; Refill: 1  6. BMI 26.0-26.9,adult Discussed diet and exercise for person with BMI >25 Will recheck weight in 3-6 months    Labs pending Health Maintenance reviewed Diet and exercise encouraged  Follow up plan: 6 months   Mary-Margaret Hassell Done, FNP

## 2022-08-11 NOTE — Patient Instructions (Signed)

## 2022-08-12 LAB — CBC WITH DIFFERENTIAL/PLATELET
Basophils Absolute: 0 10*3/uL (ref 0.0–0.2)
Basos: 1 %
EOS (ABSOLUTE): 0.1 10*3/uL (ref 0.0–0.4)
Eos: 2 %
Hematocrit: 40.7 % (ref 34.0–46.6)
Hemoglobin: 14.1 g/dL (ref 11.1–15.9)
Immature Grans (Abs): 0 10*3/uL (ref 0.0–0.1)
Immature Granulocytes: 0 %
Lymphocytes Absolute: 0.8 10*3/uL (ref 0.7–3.1)
Lymphs: 17 %
MCH: 30.2 pg (ref 26.6–33.0)
MCHC: 34.6 g/dL (ref 31.5–35.7)
MCV: 87 fL (ref 79–97)
Monocytes Absolute: 0.5 10*3/uL (ref 0.1–0.9)
Monocytes: 9 %
Neutrophils Absolute: 3.5 10*3/uL (ref 1.4–7.0)
Neutrophils: 71 %
Platelets: 125 10*3/uL — ABNORMAL LOW (ref 150–450)
RBC: 4.67 x10E6/uL (ref 3.77–5.28)
RDW: 12.1 % (ref 11.7–15.4)
WBC: 5 10*3/uL (ref 3.4–10.8)

## 2022-08-12 LAB — CMP14+EGFR
ALT: 11 IU/L (ref 0–32)
AST: 16 IU/L (ref 0–40)
Albumin/Globulin Ratio: 1.8 (ref 1.2–2.2)
Albumin: 4.4 g/dL (ref 3.8–4.8)
Alkaline Phosphatase: 105 IU/L (ref 44–121)
BUN/Creatinine Ratio: 19 (ref 12–28)
BUN: 15 mg/dL (ref 8–27)
Bilirubin Total: 0.9 mg/dL (ref 0.0–1.2)
CO2: 26 mmol/L (ref 20–29)
Calcium: 9.4 mg/dL (ref 8.7–10.3)
Chloride: 102 mmol/L (ref 96–106)
Creatinine, Ser: 0.78 mg/dL (ref 0.57–1.00)
Globulin, Total: 2.5 g/dL (ref 1.5–4.5)
Glucose: 113 mg/dL — ABNORMAL HIGH (ref 70–99)
Potassium: 4.8 mmol/L (ref 3.5–5.2)
Sodium: 141 mmol/L (ref 134–144)
Total Protein: 6.9 g/dL (ref 6.0–8.5)
eGFR: 81 mL/min/{1.73_m2} (ref >59)

## 2022-08-12 LAB — LIPID PANEL
Chol/HDL Ratio: 2.8 ratio (ref 0.0–4.4)
Cholesterol, Total: 125 mg/dL (ref 100–199)
HDL: 44 mg/dL (ref >39)
LDL Chol Calc (NIH): 66 mg/dL (ref 0–99)
Triglycerides: 73 mg/dL (ref 0–149)
VLDL Cholesterol Cal: 15 mg/dL (ref 5–40)

## 2022-09-03 ENCOUNTER — Other Ambulatory Visit: Payer: Self-pay | Admitting: Nurse Practitioner

## 2022-09-03 DIAGNOSIS — Z1231 Encounter for screening mammogram for malignant neoplasm of breast: Secondary | ICD-10-CM

## 2022-10-01 ENCOUNTER — Ambulatory Visit
Admission: RE | Admit: 2022-10-01 | Discharge: 2022-10-01 | Disposition: A | Discharge status: Home / Self Care | Payer: Medicare PPO | Source: Ambulatory Visit | Attending: Nurse Practitioner | Admitting: Nurse Practitioner

## 2022-10-01 DIAGNOSIS — Z1231 Encounter for screening mammogram for malignant neoplasm of breast: Secondary | ICD-10-CM

## 2022-10-22 DIAGNOSIS — L570 Actinic keratosis: Secondary | ICD-10-CM | POA: Diagnosis not present

## 2022-10-22 DIAGNOSIS — L821 Other seborrheic keratosis: Secondary | ICD-10-CM | POA: Diagnosis not present

## 2022-10-22 DIAGNOSIS — Z85828 Personal history of other malignant neoplasm of skin: Secondary | ICD-10-CM | POA: Diagnosis not present

## 2022-10-22 DIAGNOSIS — R21 Rash and other nonspecific skin eruption: Secondary | ICD-10-CM | POA: Diagnosis not present

## 2022-10-22 DIAGNOSIS — L814 Other melanin hyperpigmentation: Secondary | ICD-10-CM | POA: Diagnosis not present

## 2022-10-22 DIAGNOSIS — D235 Other benign neoplasm of skin of trunk: Secondary | ICD-10-CM | POA: Diagnosis not present

## 2022-10-22 DIAGNOSIS — L579 Skin changes due to chronic exposure to nonionizing radiation, unspecified: Secondary | ICD-10-CM | POA: Diagnosis not present

## 2022-10-22 DIAGNOSIS — D225 Melanocytic nevi of trunk: Secondary | ICD-10-CM | POA: Diagnosis not present

## 2022-11-03 ENCOUNTER — Encounter: Payer: Self-pay | Admitting: Nurse Practitioner

## 2022-11-03 ENCOUNTER — Ambulatory Visit: Payer: Medicare PPO | Admitting: Nurse Practitioner

## 2022-11-03 ENCOUNTER — Ambulatory Visit (INDEPENDENT_AMBULATORY_CARE_PROVIDER_SITE_OTHER): Payer: Medicare PPO

## 2022-11-03 VITALS — BP 113/67 | HR 102 | Temp 98.3°F | Ht 66.0 in | Wt 156.0 lb

## 2022-11-03 DIAGNOSIS — M25562 Pain in left knee: Secondary | ICD-10-CM

## 2022-11-03 DIAGNOSIS — M2578 Osteophyte, vertebrae: Secondary | ICD-10-CM | POA: Diagnosis not present

## 2022-11-03 DIAGNOSIS — M1712 Unilateral primary osteoarthritis, left knee: Secondary | ICD-10-CM | POA: Diagnosis not present

## 2022-11-03 DIAGNOSIS — G8929 Other chronic pain: Secondary | ICD-10-CM

## 2022-11-03 MED ORDER — PREDNISONE 10 MG PO TABS: 10.0000 mg | ORAL_TABLET | Freq: Every day | ORAL | 0 refills | Status: DC

## 2022-11-03 MED ORDER — ACETAMINOPHEN 325 MG PO TABS: 650.0000 mg | ORAL_TABLET | Freq: Four times a day (QID) | ORAL | 1 refills | Status: AC | PRN

## 2022-11-03 NOTE — Progress Notes (Signed)
Acute Office Visit  Subjective:     Patient ID: Jean [name], female    DOB: 10/05/1951, 71 y.o.   MRN: [id number]  Chief Complaint  Patient presents with   Knee Pain    Left - this has been bothering her since halloween     Knee Pain  The incident occurred more than 1 week ago. The incident occurred at home. There was no injury mechanism. The pain is present in the left knee. The quality of the pain is described as aching. The pain is at a severity of 5/10. The pain is moderate. She reports no foreign bodies present. The symptoms are aggravated by movement and palpation. She has tried acetaminophen for the symptoms. The treatment provided mild relief.     Review of Systems  Constitutional: Negative.  Negative for chills, fever, malaise/fatigue and weight loss.  HENT: Negative.    Respiratory: Negative.    Cardiovascular: Negative.   Genitourinary: Negative.  Negative for dysuria.  Musculoskeletal:  Positive for joint pain.  Skin: Negative.  Negative for itching and rash.  All other systems reviewed and are negative.       Objective:    BP 113/67   Pulse (!) 102   Temp 98.3 F (36.8 C)   Ht '5\' 6"'$  (1.676 m)   Wt 156 lb (70.8 kg)   SpO2 95%   BMI 25.18 kg/m  Wt Readings from Last 3 Encounters:  11/03/22 156 lb (70.8 kg)  08/11/22 158 lb 6.4 oz (71.8 kg)  04/10/22 157 lb (71.2 kg)      Physical Exam Vitals and nursing note reviewed.  Constitutional:      Appearance: Normal appearance.  HENT:     Head: Normocephalic.     Right Ear: External ear normal.     Left Ear: External ear normal.     Nose: Nose normal.     Mouth/Throat:     Mouth: Mucous membranes are moist.     Pharynx: Oropharynx is clear.  Eyes:     Conjunctiva/sclera: Conjunctivae normal.  Cardiovascular:     Rate and Rhythm: Normal rate and regular rhythm.     Pulses: Normal pulses.     Heart sounds: Normal heart sounds.  Pulmonary:     Effort: Pulmonary effort is normal.     Breath  sounds: Normal breath sounds.  Abdominal:     General: Bowel sounds are normal.  Musculoskeletal:     Left knee: Decreased range of motion. Tenderness present. No MCL, LCL, ACL or PCL tenderness. No LCL laxity or MCL laxity.    Comments: Chronic Left knee pain   Skin:    General: Skin is warm.     Findings: No erythema or rash.  Neurological:     General: No focal deficit present.     Mental Status: She is alert and oriented to person, place, and time.     No results found for any visits on 11/03/22.      Assessment & Plan:  Pain symptoms not well controlled , this is  chronic for patient. , she is currently using tylenol with moderate results.  Continue Tylenol, apply Ice/ warm compress as  tolerated, Voltaren topical gel, completed x-ray with results pending, completed Arthritis panel and sedimentation rate.   Follow up with worsening unresolved symptoms.    Problem List Items Addressed This Visit   None Visit Diagnoses     Chronic pain of left knee    -  Primary  Relevant Medications   predniSONE (DELTASONE) 10 MG tablet   acetaminophen (TYLENOL) 325 MG tablet   Other Relevant Orders   DG Knee 1-2 Views Left   Arthritis Panel   Sedimentation Rate       Meds ordered this encounter  Medications   predniSONE (DELTASONE) 10 MG tablet    Sig: Take 1 tablet (10 mg total) by mouth daily with breakfast.    Dispense:  6 tablet    Refill:  0    Order Specific Question:   Supervising Provider    Answer:   Jeneen Rinks   acetaminophen (TYLENOL) 325 MG tablet    Sig: Take 2 tablets (650 mg total) by mouth every 6 (six) hours as needed.    Dispense:  60 tablet    Refill:  1    Order Specific Question:   Supervising Provider    Answer:   Claretta Fraise [phone]    Return if symptoms worsen or fail to improve.  Ivy Lynn, NP

## 2022-11-03 NOTE — Patient Instructions (Signed)
 Chronic Knee Pain, Adult Knee pain that lasts longer than 3 months is called chronic knee pain. You may have pain in one or both knees. Symptoms of chronic knee pain may also include swelling and stiffness. The most common cause is age-related wear and tear (osteoarthritis) of your knee joint. Many conditions can cause chronic knee pain. Treatment depends on the cause. The main treatments are physical therapy and weight loss. It may also be treated with medicines, injections, a knee sleeve or brace, and by using crutches. Rest, ice, pressure (compression), and elevation, also known as RICE therapy, may also be recommended. Follow these instructions at home: If you have a knee sleeve or brace:  Wear the knee sleeve or brace as told by your doctor. Take it off only as told by your doctor. Loosen it if your toes: Tingle. Become numb. Turn cold and blue. Keep it clean. If the sleeve or brace is not waterproof: Do not let it get wet. Ask your doctor if you may take it off when you take a bath or shower. If not, cover it with a watertight covering. Managing pain, stiffness, and swelling     If told, put heat on your knee. Do this as often as told by your doctor. Use the heat source that your doctor recommends, such as a moist heat pack or a heating pad. If you have a removable knee sleeve or brace, take it off as told by your doctor. Place a towel between your skin and the heat source. Leave the heat on for 20-30 minutes. Take off the heat if your skin turns bright red. This is very important. If you cannot feel pain, heat, or cold, you have a greater risk of getting burned. If told, put ice on your knee. To do this: If you have a removable knee sleeve or brace, take it off as told by your doctor. Put ice in a plastic bag. Place a towel between your skin and the bag. Leave the ice on for 20 minutes, 2-3 times a day. Take off the ice if your skin turns bright red. This is very important. If  you cannot feel pain, heat, or cold, you have a greater risk of damage to the area. Move your toes often. Raise the injured area above the level of your heart while you are sitting or lying down. Activity Avoid activities where both feet leave the ground at the same time (high-impact activities). Examples are running, jumping rope, and doing jumping jacks. Follow the exercise plan that your doctor makes for you. Your doctor may suggest that you: Avoid activities that make knee pain worse. You may need to change the exercises that you do, the sports that you participate in, or your job duties. Wear shoes with cushioned soles. Avoid sports that require running and sudden changes in direction. Do exercises or physical therapy. This is planned to match your needs and your abilities. Do exercises that increase your balance and strength, such as tai chi and yoga. Do not use your injured knee to support your body weight until your doctor says that you can. Use crutches as told by your doctor. Return to your normal activities when your doctor says that it is safe. General instructions Take over-the-counter and prescription medicines only as told by your doctor. If you are overweight, work with your doctor and a food expert (dietitian) to set goals to lose weight. Being overweight can make your knee hurt more. Do not smoke or use   any products that contain nicotine or tobacco. If you need help quitting, ask your doctor. Keep all follow-up visits. Contact a doctor if: You have knee pain that is not getting better or gets worse. You are not able to do your exercises due to knee pain. Get help right away if: Your knee swells and the swelling gets worse. You cannot move your knee. You have very bad knee pain. Summary Knee pain that lasts more than 3 months is called chronic knee pain. The main treatments for chronic knee pain are physical therapy and weight loss. You may also need to take medicines,  wear a knee sleeve or brace, use crutches, and put ice or heat on your knee. Lose weight if you are overweight. Work with your doctor and a food expert (dietitian) to help you set goals to lose weight. Being overweight can make your knee hurt more. Follow the exercise plan that your doctor makes for you. This information is not intended to replace advice given to you by your health care provider. Make sure you discuss any questions you have with your health care provider. Document Revised: 05/23/2020 Document Reviewed: 05/23/2020 Elsevier Patient Education  2023 [employer] Inc.  

## 2022-11-04 LAB — ARTHRITIS PANEL
Basophils Absolute: 0 10*3/uL (ref 0.0–0.2)
Basos: 1 %
EOS (ABSOLUTE): 0.2 10*3/uL (ref 0.0–0.4)
Eos: 4 %
Hematocrit: 43.5 % (ref 34.0–46.6)
Hemoglobin: 14.5 g/dL (ref 11.1–15.9)
Immature Grans (Abs): 0 10*3/uL (ref 0.0–0.1)
Immature Granulocytes: 0 %
Lymphocytes Absolute: 0.8 10*3/uL (ref 0.7–3.1)
Lymphs: 15 %
MCH: 30.7 pg (ref 26.6–33.0)
MCHC: 33.3 g/dL (ref 31.5–35.7)
MCV: 92 fL (ref 79–97)
Monocytes Absolute: 0.4 10*3/uL (ref 0.1–0.9)
Monocytes: 9 %
Neutrophils Absolute: 3.6 10*3/uL (ref 1.4–7.0)
Neutrophils: 71 %
Platelets: 123 10*3/uL — ABNORMAL LOW (ref 150–450)
RBC: 4.73 x10E6/uL (ref 3.77–5.28)
RDW: 12.8 % (ref 11.7–15.4)
Rheumatoid fact SerPl-aCnc: <10 IU/mL (ref <14.0)
Sed Rate: 5 mm/hr (ref 0–40)
Uric Acid: 5 mg/dL (ref 3.1–7.9)
WBC: 5 10*3/uL (ref 3.4–10.8)

## 2022-11-05 DIAGNOSIS — H0102A Squamous blepharitis right eye, upper and lower eyelids: Secondary | ICD-10-CM | POA: Diagnosis not present

## 2022-11-05 DIAGNOSIS — H0288A Meibomian gland dysfunction right eye, upper and lower eyelids: Secondary | ICD-10-CM | POA: Diagnosis not present

## 2022-11-05 DIAGNOSIS — Z961 Presence of intraocular lens: Secondary | ICD-10-CM | POA: Diagnosis not present

## 2022-11-05 DIAGNOSIS — H04123 Dry eye syndrome of bilateral lacrimal glands: Secondary | ICD-10-CM | POA: Diagnosis not present

## 2022-11-05 DIAGNOSIS — H0102B Squamous blepharitis left eye, upper and lower eyelids: Secondary | ICD-10-CM | POA: Diagnosis not present

## 2022-11-05 DIAGNOSIS — H353131 Nonexudative age-related macular degeneration, bilateral, early dry stage: Secondary | ICD-10-CM | POA: Diagnosis not present

## 2022-11-05 DIAGNOSIS — H0288B Meibomian gland dysfunction left eye, upper and lower eyelids: Secondary | ICD-10-CM | POA: Diagnosis not present

## 2022-11-11 ENCOUNTER — Telehealth: Payer: Self-pay | Admitting: Nurse Practitioner

## 2022-11-11 DIAGNOSIS — M25562 Pain in left knee: Secondary | ICD-10-CM | POA: Diagnosis not present

## 2022-11-11 NOTE — Telephone Encounter (Signed)
Disc placed up front for pt to pick up - pt aware

## 2022-11-11 NOTE — Telephone Encounter (Signed)
Patient made an appt with [facility] for 3pm this afternoon (1121) and needs her x ray from 11/14 of her knee to take to them. Please call when ready for pick up.

## 2023-02-12 ENCOUNTER — Encounter: Payer: Self-pay | Admitting: Nurse Practitioner

## 2023-02-12 ENCOUNTER — Ambulatory Visit: Payer: Medicare PPO | Admitting: Nurse Practitioner

## 2023-02-12 ENCOUNTER — Other Ambulatory Visit: Payer: Self-pay | Admitting: Nurse Practitioner

## 2023-02-12 VITALS — BP 132/76 | HR 78 | Temp 97.2°F | Resp 20 | Ht 66.0 in | Wt 158.0 lb

## 2023-02-12 DIAGNOSIS — I480 Paroxysmal atrial fibrillation: Secondary | ICD-10-CM

## 2023-02-12 DIAGNOSIS — Z0000 Encounter for general adult medical examination without abnormal findings: Secondary | ICD-10-CM | POA: Diagnosis not present

## 2023-02-12 DIAGNOSIS — I471 Supraventricular tachycardia, unspecified: Secondary | ICD-10-CM

## 2023-02-12 DIAGNOSIS — Z0001 Encounter for general adult medical examination with abnormal findings: Secondary | ICD-10-CM | POA: Diagnosis not present

## 2023-02-12 DIAGNOSIS — F3342 Major depressive disorder, recurrent, in full remission: Secondary | ICD-10-CM | POA: Diagnosis not present

## 2023-02-12 DIAGNOSIS — E7800 Pure hypercholesterolemia, unspecified: Secondary | ICD-10-CM

## 2023-02-12 DIAGNOSIS — Z1211 Encounter for screening for malignant neoplasm of colon: Secondary | ICD-10-CM

## 2023-02-12 DIAGNOSIS — R879 Unspecified abnormal finding in specimens from female genital organs: Secondary | ICD-10-CM | POA: Diagnosis not present

## 2023-02-12 DIAGNOSIS — Z6826 Body mass index (BMI) 26.0-26.9, adult: Secondary | ICD-10-CM | POA: Diagnosis not present

## 2023-02-12 DIAGNOSIS — I10 Essential (primary) hypertension: Secondary | ICD-10-CM | POA: Diagnosis not present

## 2023-02-12 MED ORDER — ROSUVASTATIN CALCIUM 20 MG PO TABS: 20.0000 mg | ORAL_TABLET | Freq: Every day | ORAL | 1 refills | Status: DC

## 2023-02-12 MED ORDER — DILTIAZEM HCL ER COATED BEADS 180 MG PO CP24: 180.0000 mg | ORAL_CAPSULE | Freq: Every day | ORAL | 1 refills | Status: DC

## 2023-02-12 MED ORDER — APIXABAN 5 MG PO TABS: 5.0000 mg | ORAL_TABLET | Freq: Two times a day (BID) | ORAL | 6 refills | Status: DC

## 2023-02-12 MED ORDER — SERTRALINE HCL 50 MG PO TABS: 50.0000 mg | ORAL_TABLET | Freq: Every day | ORAL | 1 refills | Status: DC

## 2023-02-12 MED ORDER — VALSARTAN-HYDROCHLOROTHIAZIDE 160-12.5 MG PO TABS: 1.0000 | ORAL_TABLET | Freq: Every day | ORAL | 1 refills | Status: DC

## 2023-02-12 NOTE — Patient Instructions (Signed)

## 2023-02-12 NOTE — Progress Notes (Signed)
Subjective:    Patient ID: Jean [name], female    DOB: June 28, 1951, 72 y.o.   MRN: [id number]   Chief Complaint: annual physical    HPI:  Jean [name] is a 72 y.o. who identifies as a female who was assigned female at birth.   Social history: Lives with: husband Work history: retired   Scientist, forensic in today for follow up of the following chronic medical issues:  1. Primary hypertension No c/o chest pain, sob or headache. Does not check blood pressure at home. BP Readings from Last 3 Encounters:  11/03/22 113/67  08/11/22 127/82  04/10/22 130/82     2. Pure hypercholesterolemia Does watch diet and tries to stay very active. Lab Results  Component Value Date   CHOL 125 08/11/2022   HDL 44 08/11/2022   LDLCALC 66 08/11/2022   TRIG 73 08/11/2022   CHOLHDL 2.8 08/11/2022     3. Paroxysmal atrial fibrillation (HCC) No palpitations or heart racing. Is on eliquis daily for clot prevention.  4. SVT (supraventricular tachycardia) Is on cardizem daily which works well to keep heart rate under control.  5. depression Is on zoloft and is doing well.    02/12/2023    8:08 AM 11/03/2022   11:09 AM 08/11/2022    8:01 AM  Depression screen PHQ 2/9  Decreased Interest 0 0 0  Down, Depressed, Hopeless 0 0 0  PHQ - 2 Score 0 0 0  Altered sleeping 0 0 1  Tired, decreased energy 0 0 1  Change in appetite 0 0 0  Feeling bad or failure about yourself  0 0 0  Trouble concentrating 0 0 0  Moving slowly or fidgety/restless 0 0 0  Suicidal thoughts 0 0 0  PHQ-9 Score 0 0 2  Difficult doing work/chores Not difficult at all Not difficult at all Not difficult at all     6. BMI 26.0-26.9,adult No recent weight changes Wt Readings from Last 3 Encounters:  02/12/23 158 lb (71.7 kg)  11/03/22 156 lb (70.8 kg)  08/11/22 158 lb 6.4 oz (71.8 kg)   BMI Readings from Last 3 Encounters:  02/12/23 25.50 kg/m  11/03/22 25.18 kg/m  08/11/22 25.57 kg/m     New  complaints: None today  No Known Allergies Outpatient Encounter Medications as of 02/12/2023  Medication Sig   acetaminophen (TYLENOL) 325 MG tablet Take 2 tablets (650 mg total) by mouth every 6 (six) hours as needed.   apixaban (ELIQUIS) 5 MG TABS tablet Take 1 tablet (5 mg total) by mouth 2 (two) times daily.   diltiazem (CARDIZEM CD) 180 MG 24 hr capsule Take 1 capsule (180 mg total) by mouth daily.   Fexofenadine HCl (ALLEGRA PO) Take 1 tablet by mouth daily as needed.    Multiple Vitamins-Minerals (PRESERVISION AREDS 2) CAPS SMARTSIG:1 Tablet(s) By Mouth   Prednisol Ace-Moxiflox-Bromfen 1-0.5-0.075 % SUSP Place 1 drop into the left eye 4 (four) times daily.   predniSONE (DELTASONE) 10 MG tablet Take 1 tablet (10 mg total) by mouth daily with breakfast.   rosuvastatin (CRESTOR) 20 MG tablet Take 1 tablet (20 mg total) by mouth daily.   sertraline (ZOLOFT) 50 MG tablet Take 1 tablet (50 mg total) by mouth daily.   valsartan-hydrochlorothiazide (DIOVAN-HCT) 160-12.5 MG tablet Take 1 tablet by mouth daily.   No facility-administered encounter medications on file as of 02/12/2023.    Past Surgical History:  Procedure Laterality Date   BASAL CELL CARCINOMA EXCISION  2021  One on top of head and one on right arm   US ECHOCARDIOGRAPHY     EF 60%    Family History  Problem Relation Age of Onset   Heart disease Mother    Hypertension Sister    Diabetes Sister    Anemia Paternal Grandmother       Controlled substance contract: n/a     Review of Systems  Constitutional:  Negative for diaphoresis.  Eyes:  Negative for pain.  Respiratory:  Negative for shortness of breath.   Cardiovascular:  Negative for chest pain, palpitations and leg swelling.  Gastrointestinal:  Negative for abdominal pain.  Endocrine: Negative for polydipsia.  Skin:  Negative for rash.  Neurological:  Negative for dizziness, weakness and headaches.  Hematological:  Does not bruise/bleed easily.  All  other systems reviewed and are negative.      Objective:   Physical Exam Vitals and nursing note reviewed.  Constitutional:      General: She is not in acute distress.    Appearance: Normal appearance. She is well-developed.  HENT:     Head: Normocephalic.     Right Ear: Tympanic membrane normal.     Left Ear: Tympanic membrane normal.     Nose: Nose normal.     Mouth/Throat:     Mouth: Mucous membranes are moist.  Eyes:     Pupils: Pupils are equal, round, and reactive to light.  Neck:     Vascular: No carotid bruit or JVD.  Cardiovascular:     Rate and Rhythm: Normal rate and regular rhythm.     Heart sounds: Normal heart sounds.  Pulmonary:     Effort: Pulmonary effort is normal. No respiratory distress.     Breath sounds: Normal breath sounds. No wheezing or rales.  Chest:     Chest wall: No tenderness.  Abdominal:     General: Bowel sounds are normal. There is no distension or abdominal bruit.     Palpations: Abdomen is soft. There is no hepatomegaly, splenomegaly, mass or pulsatile mass.     Tenderness: There is no abdominal tenderness.  Musculoskeletal:        General: Normal range of motion.     Cervical back: Normal range of motion and neck supple.  Lymphadenopathy:     Cervical: No cervical adenopathy.  Skin:    General: Skin is warm and dry.  Neurological:     Mental Status: She is alert and oriented to person, place, and time.     Deep Tendon Reflexes: Reflexes are normal and symmetric.  Psychiatric:        Behavior: Behavior normal.        Thought Content: Thought content normal.        Judgment: Judgment normal.    BP 132/76   Pulse 78   Temp (!) 97.2 F (36.2 C) (Temporal)   Resp 20   Ht 5' 6"$  (1.676 m)   Wt 158 lb (71.7 kg)   SpO2 96%   BMI 25.50 kg/m         Assessment & Plan:   Jean [name] comes in today with chief complaint of Annual Exam   Diagnosis and orders addressed:  1. Annual physical exam - Thyroid Panel With  TSH - VITAMIN D 25 Hydroxy (Vit-D Deficiency, Fractures)  2. Primary hypertension Low sodium diet - valsartan-hydrochlorothiazide (DIOVAN-HCT) 160-12.5 MG tablet; Take 1 tablet by mouth daily.  Dispense: 90 tablet; Refill: 1 - CBC with Differential/Platelet - CMP14+EGFR  3. Pure hypercholesterolemia Low fat diet - rosuvastatin (CRESTOR) 20 MG tablet; Take 1 tablet (20 mg total) by mouth daily.  Dispense: 90 tablet; Refill: 1 - Lipid panel  4. Paroxysmal atrial fibrillation (HCC) Avoid caffeine  5. SVT (supraventricular tachycardia) - apixaban (ELIQUIS) 5 MG TABS tablet; Take 1 tablet (5 mg total) by mouth 2 (two) times daily.  Dispense: 60 tablet; Refill: 6 - diltiazem (CARDIZEM CD) 180 MG 24 hr capsule; Take 1 capsule (180 mg total) by mouth daily.  Dispense: 90 capsule; Refill: 1  6. BMI 26.0-26.9,adult Discussed diet and exercise for person with BMI >25 Will recheck weight in 3-6 months   7. Encounter for screening colonoscopy - Ambulatory referral to Gastroenterology  8. Recurrent major depressive disorder, in full remission ([place]) Stress management - sertraline (ZOLOFT) 50 MG tablet; Take 1 tablet (50 mg total) by mouth daily.  Dispense: 90 tablet; Refill: 1   Labs pending Health Maintenance reviewed Diet and exercise encouraged  Follow up plan: 6 month   [place], FNP

## 2023-02-13 LAB — CBC WITH DIFFERENTIAL/PLATELET
Basophils Absolute: 0 10*3/uL (ref 0.0–0.2)
Basos: 1 %
EOS (ABSOLUTE): 0.1 10*3/uL (ref 0.0–0.4)
Eos: 2 %
Hematocrit: 40.5 % (ref 34.0–46.6)
Hemoglobin: 14 g/dL (ref 11.1–15.9)
Immature Grans (Abs): 0 10*3/uL (ref 0.0–0.1)
Immature Granulocytes: 0 %
Lymphocytes Absolute: 0.8 10*3/uL (ref 0.7–3.1)
Lymphs: 13 %
MCH: 30.6 pg (ref 26.6–33.0)
MCHC: 34.6 g/dL (ref 31.5–35.7)
MCV: 89 fL (ref 79–97)
Monocytes Absolute: 0.6 10*3/uL (ref 0.1–0.9)
Monocytes: 10 %
Neutrophils Absolute: 4.4 10*3/uL (ref 1.4–7.0)
Neutrophils: 74 %
Platelets: 117 10*3/uL — ABNORMAL LOW (ref 150–450)
RBC: 4.57 x10E6/uL (ref 3.77–5.28)
RDW: 12.1 % (ref 11.7–15.4)
WBC: 5.9 10*3/uL (ref 3.4–10.8)

## 2023-02-13 LAB — LIPID PANEL
Chol/HDL Ratio: 2.9 ratio (ref 0.0–4.4)
Cholesterol, Total: 135 mg/dL (ref 100–199)
HDL: 47 mg/dL (ref >39)
LDL Chol Calc (NIH): 74 mg/dL (ref 0–99)
Triglycerides: 68 mg/dL (ref 0–149)
VLDL Cholesterol Cal: 14 mg/dL (ref 5–40)

## 2023-02-13 LAB — THYROID PANEL WITH TSH
Free Thyroxine Index: 1.8 (ref 1.2–4.9)
T3 Uptake Ratio: 27 % (ref 24–39)
T4, Total: 6.6 ug/dL (ref 4.5–12.0)
TSH: 1.47 u[IU]/mL (ref 0.450–4.500)

## 2023-02-13 LAB — CMP14+EGFR
ALT: 15 IU/L (ref 0–32)
AST: 17 IU/L (ref 0–40)
Albumin/Globulin Ratio: 1.8 (ref 1.2–2.2)
Albumin: 4.5 g/dL (ref 3.8–4.8)
Alkaline Phosphatase: 111 IU/L (ref 44–121)
BUN/Creatinine Ratio: 25 (ref 12–28)
BUN: 18 mg/dL (ref 8–27)
Bilirubin Total: 0.8 mg/dL (ref 0.0–1.2)
CO2: 24 mmol/L (ref 20–29)
Calcium: 9.5 mg/dL (ref 8.7–10.3)
Chloride: 103 mmol/L (ref 96–106)
Creatinine, Ser: 0.72 mg/dL (ref 0.57–1.00)
Globulin, Total: 2.5 g/dL (ref 1.5–4.5)
Glucose: 123 mg/dL — ABNORMAL HIGH (ref 70–99)
Potassium: 4.6 mmol/L (ref 3.5–5.2)
Sodium: 144 mmol/L (ref 134–144)
Total Protein: 7 g/dL (ref 6.0–8.5)
eGFR: 89 mL/min/{1.73_m2} (ref >59)

## 2023-02-13 LAB — VITAMIN D 25 HYDROXY (VIT D DEFICIENCY, FRACTURES): Vit D, 25-Hydroxy: 31.1 ng/mL (ref 30.0–100.0)

## 2023-02-18 LAB — SPECIMEN STATUS REPORT

## 2023-02-18 LAB — HGB A1C W/O EAG: Hgb A1c MFr Bld: 6.1 % — ABNORMAL HIGH (ref 4.8–5.6)

## 2023-02-19 ENCOUNTER — Encounter: Payer: Self-pay | Admitting: Nurse Practitioner

## 2023-02-19 DIAGNOSIS — E119 Type 2 diabetes mellitus without complications: Secondary | ICD-10-CM | POA: Insufficient documentation

## 2023-03-06 ENCOUNTER — Telehealth: Payer: Self-pay | Admitting: Nurse Practitioner

## 2023-03-06 NOTE — Telephone Encounter (Signed)
Patient called about her GI referral from 2-22 and gave her the information showing referral and patient called the [facility] in [place] and they had no referral. Patient is asking to go to [facility] in [place].

## 2023-04-08 DIAGNOSIS — E7800 Pure hypercholesterolemia, unspecified: Secondary | ICD-10-CM | POA: Diagnosis not present

## 2023-04-08 DIAGNOSIS — K641 Second degree hemorrhoids: Secondary | ICD-10-CM | POA: Diagnosis not present

## 2023-04-08 DIAGNOSIS — Z1211 Encounter for screening for malignant neoplasm of colon: Secondary | ICD-10-CM | POA: Diagnosis not present

## 2023-04-08 LAB — HM COLONOSCOPY

## 2023-04-21 NOTE — Progress Notes (Signed)
Jean [name] Date of Birth: 1951/02/21   History of Present Illness: Jean [name] is seen today for yearly followup. She has a history of SVT in the past and PVCs. Echo in 2012 showed normal LV function. Event monitor then showed predominantly PVCs with only a 4 beat run of PAT.   She was seen by Azalee Course PA-C for a televisit in May with complaints of increased palpitations. Zio monitor was placed. This demonstrated Afib with RVR rate ranging from 64-196. Burden 11%. Average HR 124. Nonsustained runs of atrial tachycardia. Metoprolol increased to 25 mg bid. Started on Eliquis and diltiazem. ASA discontinued. Echo showed normal EF with mild AI and mild LVH.   On follow up today she states she still notices irregular rhythms but rate is controlled. She thinks she may be in aFib most of the time. It doesn't really bother her. Can only tell by checking her pulse.  She denies any chest pain, dizziness, syncope, SOB. Tolerating medication well.   Current Outpatient Medications on File Prior to Visit  Medication Sig Dispense Refill   acetaminophen (TYLENOL) 325 MG tablet Take 2 tablets (650 mg total) by mouth every 6 (six) hours as needed. 60 tablet 1   apixaban (ELIQUIS) 5 MG TABS tablet Take 1 tablet (5 mg total) by mouth 2 (two) times daily. 60 tablet 6   Cholecalciferol (D3 2000) 50 MCG (2000 UT) CAPS Take 1 capsule by mouth daily.     diltiazem (CARDIZEM CD) 180 MG 24 hr capsule Take 1 capsule (180 mg total) by mouth daily. 90 capsule 1   Fexofenadine HCl (ALLEGRA PO) Take 1 tablet by mouth daily as needed.      Multiple Vitamins-Minerals (PRESERVISION AREDS 2) CAPS SMARTSIG:1 Tablet(s) By Mouth     Prednisol Ace-Moxiflox-Bromfen 1-0.5-0.075 % SUSP Place 1 drop into the left eye 4 (four) times daily.     Propylene Glycol (SYSTANE COMPLETE OP) Apply 1 drop to eye 4 (four) times daily.     rosuvastatin (CRESTOR) 20 MG tablet Take 1 tablet (20 mg total) by mouth daily. 90 tablet 1   sertraline  (ZOLOFT) 50 MG tablet Take 1 tablet (50 mg total) by mouth daily. 90 tablet 1   valsartan-hydrochlorothiazide (DIOVAN-HCT) 160-12.5 MG tablet Take 1 tablet by mouth daily. 90 tablet 1   No current facility-administered medications on file prior to visit.    No Known Allergies  Past Medical History:  Diagnosis Date   Gestational diabetes    WITH HER CHILDBIRTH   Hyperlipidemia    Hypertension    Mitral insufficiency    TRIVIAL   Palpitations    PVC's (premature ventricular contractions) 03/16/2014   SVT (supraventricular tachycardia)     Past Surgical History:  Procedure Laterality Date   BASAL CELL CARCINOMA EXCISION  2021   One on top of head and one on right arm   US ECHOCARDIOGRAPHY     EF 60%    Social History   Tobacco Use  Smoking Status Never  Smokeless Tobacco Never    Social History   Substance and Sexual Activity  Alcohol Use No    Family History  Problem Relation Age of Onset   Heart disease Mother    Hypertension Sister    Diabetes Sister    Anemia Paternal Grandmother     Review of Systems: As noted in HPI. All other systems were reviewed and are negative.  Physical Exam: BP 128/74   Pulse 76   Ht 5'  6" (1.676 m)   Wt 155 lb (70.3 kg)   SpO2 98%   BMI 25.02 kg/m  GENERAL:  Well appearing WF in NAD HEENT:  PERRL, EOMI, sclera are clear. Oropharynx is clear. NECK:  No jugular venous distention, carotid upstroke brisk and symmetric, no bruits, no thyromegaly or adenopathy LUNGS:  Clear to auscultation bilaterally CHEST:  Unremarkable HEART:  IRRR,  PMI not displaced or sustained,S1 and S2 within normal limits, no S3, no S4: no clicks, no rubs, no murmurs ABD:  Soft, nontender. BS +, no masses or bruits. No hepatomegaly, no splenomegaly EXT:  2 + pulses throughout, no edema, no cyanosis no clubbing SKIN:  Warm and dry.  No rashes NEURO:  Alert and oriented x 3. Cranial nerves II through XII intact. PSYCH:  Cognitively  intact    LABORATORY DATA:  Lab Results  Component Value Date   WBC 5.9 02/12/2023   HGB 14.0 02/12/2023   HCT 40.5 02/12/2023   PLT 117 (L) 02/12/2023   GLUCOSE 123 (H) 02/12/2023   CHOL 135 02/12/2023   TRIG 68 02/12/2023   HDL 47 02/12/2023   LDLCALC 74 02/12/2023   ALT 15 02/12/2023   AST 17 02/12/2023   NA 144 02/12/2023   K 4.6 02/12/2023   CL 103 02/12/2023   CREATININE 0.72 02/12/2023   BUN 18 02/12/2023   CO2 24 02/12/2023   TSH 1.470 02/12/2023   HGBA1C 6.1 (H) 02/12/2023    Ecg today shows Afib  rate 76. Otherwise normal.  I have personally reviewed and interpreted this study.  Echo 07/04/19: IMPRESSIONS      1. The left ventricle has normal systolic function with an ejection fraction of 60-65%. The cavity size was normal. There is mildly increased left ventricular wall thickness. Left ventricular diastolic Doppler parameters are indeterminate.  2. The right ventricle has normal systolic function. The cavity was normal. There is no increase in right ventricular wall thickness.  3. The aortic valve is tricuspid. Mild thickening of the aortic valve. Aortic valve regurgitation is mild by color flow Doppler.  Zio monitor: 06/28/19: Study Highlights  NSR Atrial fibrillation with RVR. Burden 11%. HR range 64-196 with average 124 bpm Nonsustained runs of atrial tachycardia.     Assessment / Plan: 1. Paroxysmal Afib - likely persistent now.  Now on Eliquis. [place] vasc score of 3.   Doing well on diltizazem. She is really having no significant symptoms with Afib so I would continue rate control strategy. Follow up in one year.   2.  Hypertension, well controlled   3. Hyperlipidemia. On Crestor. LDL 74  Follow up in one year

## 2023-04-22 DIAGNOSIS — L814 Other melanin hyperpigmentation: Secondary | ICD-10-CM | POA: Diagnosis not present

## 2023-04-22 DIAGNOSIS — L579 Skin changes due to chronic exposure to nonionizing radiation, unspecified: Secondary | ICD-10-CM | POA: Diagnosis not present

## 2023-04-22 DIAGNOSIS — Z85828 Personal history of other malignant neoplasm of skin: Secondary | ICD-10-CM | POA: Diagnosis not present

## 2023-04-22 DIAGNOSIS — D235 Other benign neoplasm of skin of trunk: Secondary | ICD-10-CM | POA: Diagnosis not present

## 2023-04-22 DIAGNOSIS — L821 Other seborrheic keratosis: Secondary | ICD-10-CM | POA: Diagnosis not present

## 2023-04-22 DIAGNOSIS — D485 Neoplasm of uncertain behavior of skin: Secondary | ICD-10-CM | POA: Diagnosis not present

## 2023-04-22 DIAGNOSIS — L570 Actinic keratosis: Secondary | ICD-10-CM | POA: Diagnosis not present

## 2023-04-24 ENCOUNTER — Encounter: Payer: Self-pay | Admitting: Cardiology

## 2023-04-24 ENCOUNTER — Ambulatory Visit: Payer: Medicare PPO | Attending: Cardiology | Admitting: Cardiology

## 2023-04-24 VITALS — BP 128/74 | HR 76 | Ht 66.0 in | Wt 155.0 lb

## 2023-04-24 DIAGNOSIS — I10 Essential (primary) hypertension: Secondary | ICD-10-CM | POA: Diagnosis not present

## 2023-04-24 DIAGNOSIS — I480 Paroxysmal atrial fibrillation: Secondary | ICD-10-CM

## 2023-04-24 DIAGNOSIS — E785 Hyperlipidemia, unspecified: Secondary | ICD-10-CM | POA: Diagnosis not present

## 2023-04-24 NOTE — Patient Instructions (Signed)
Medication Instructions:  Continue same medications *If you need a refill on your cardiac medications before your next appointment, please call your pharmacy*   Lab Work: None ordered   Testing/Procedures: None ordered   Follow-Up: At [facility], you and your health needs are our priority.  As part of our continuing mission to provide you with exceptional heart care, we have created designated Provider Care Teams.  These Care Teams include your primary Cardiologist (physician) and Advanced Practice Providers (APPs -  Physician Assistants and Nurse Practitioners) who all work together to provide you with the care you need, when you need it.  We recommend signing up for the patient portal called "MyChart".  Sign up information is provided on this After Visit Summary.  MyChart is used to connect with patients for Virtual Visits (Telemedicine).  Patients are able to view lab/test results, encounter notes, upcoming appointments, etc.  Non-urgent messages can be sent to your provider as well.   To learn more about what you can do with MyChart, go to [email or web address].    Your next appointment:  1 year    Call in Feb to schedule May appointment     Provider:  Dr.Jordan   

## 2023-04-24 NOTE — Addendum Note (Signed)
Addended by: Neoma Laming on: 04/24/2023 11:11 AM   Modules accepted: Orders

## 2023-05-31 DIAGNOSIS — Z7901 Long term (current) use of anticoagulants: Secondary | ICD-10-CM | POA: Diagnosis not present

## 2023-05-31 DIAGNOSIS — F419 Anxiety disorder, unspecified: Secondary | ICD-10-CM | POA: Diagnosis not present

## 2023-05-31 DIAGNOSIS — D6869 Other thrombophilia: Secondary | ICD-10-CM | POA: Diagnosis not present

## 2023-05-31 DIAGNOSIS — I4891 Unspecified atrial fibrillation: Secondary | ICD-10-CM | POA: Diagnosis not present

## 2023-05-31 DIAGNOSIS — E785 Hyperlipidemia, unspecified: Secondary | ICD-10-CM | POA: Diagnosis not present

## 2023-05-31 DIAGNOSIS — J309 Allergic rhinitis, unspecified: Secondary | ICD-10-CM | POA: Diagnosis not present

## 2023-05-31 DIAGNOSIS — M1990 Unspecified osteoarthritis, unspecified site: Secondary | ICD-10-CM | POA: Diagnosis not present

## 2023-05-31 DIAGNOSIS — I10 Essential (primary) hypertension: Secondary | ICD-10-CM | POA: Diagnosis not present

## 2023-05-31 DIAGNOSIS — Z961 Presence of intraocular lens: Secondary | ICD-10-CM | POA: Diagnosis not present

## 2023-07-31 DIAGNOSIS — M25561 Pain in right knee: Secondary | ICD-10-CM | POA: Diagnosis not present

## 2023-08-05 DIAGNOSIS — M25561 Pain in right knee: Secondary | ICD-10-CM | POA: Diagnosis not present

## 2023-08-14 ENCOUNTER — Ambulatory Visit: Payer: Medicare PPO | Admitting: Nurse Practitioner

## 2023-08-14 ENCOUNTER — Encounter: Payer: Self-pay | Admitting: Nurse Practitioner

## 2023-08-14 VITALS — BP 130/79 | HR 99 | Temp 97.6°F | Resp 20 | Ht 66.0 in | Wt 156.0 lb

## 2023-08-14 DIAGNOSIS — I471 Supraventricular tachycardia, unspecified: Secondary | ICD-10-CM

## 2023-08-14 DIAGNOSIS — Z6826 Body mass index (BMI) 26.0-26.9, adult: Secondary | ICD-10-CM | POA: Diagnosis not present

## 2023-08-14 DIAGNOSIS — F3342 Major depressive disorder, recurrent, in full remission: Secondary | ICD-10-CM | POA: Diagnosis not present

## 2023-08-14 DIAGNOSIS — E7800 Pure hypercholesterolemia, unspecified: Secondary | ICD-10-CM | POA: Diagnosis not present

## 2023-08-14 DIAGNOSIS — I10 Essential (primary) hypertension: Secondary | ICD-10-CM

## 2023-08-14 DIAGNOSIS — E119 Type 2 diabetes mellitus without complications: Secondary | ICD-10-CM | POA: Diagnosis not present

## 2023-08-14 DIAGNOSIS — E785 Hyperlipidemia, unspecified: Secondary | ICD-10-CM

## 2023-08-14 DIAGNOSIS — I480 Paroxysmal atrial fibrillation: Secondary | ICD-10-CM | POA: Diagnosis not present

## 2023-08-14 DIAGNOSIS — E1169 Type 2 diabetes mellitus with other specified complication: Secondary | ICD-10-CM | POA: Diagnosis not present

## 2023-08-14 LAB — CBC WITH DIFFERENTIAL/PLATELET
Basophils Absolute: 0 10*3/uL (ref 0.0–0.2)
Basos: 0 %
EOS (ABSOLUTE): 0.1 10*3/uL (ref 0.0–0.4)
Eos: 1 %
Hematocrit: 45.1 % (ref 34.0–46.6)
Hemoglobin: 15.3 g/dL (ref 11.1–15.9)
Immature Grans (Abs): 0 10*3/uL (ref 0.0–0.1)
Immature Granulocytes: 0 %
Lymphocytes Absolute: 1 10*3/uL (ref 0.7–3.1)
Lymphs: 18 %
MCH: 30.8 pg (ref 26.6–33.0)
MCHC: 33.9 g/dL (ref 31.5–35.7)
MCV: 91 fL (ref 79–97)
Monocytes Absolute: 0.5 10*3/uL (ref 0.1–0.9)
Monocytes: 9 %
Neutrophils Absolute: 4.1 10*3/uL (ref 1.4–7.0)
Neutrophils: 72 %
Platelets: 128 10*3/uL — ABNORMAL LOW (ref 150–450)
RBC: 4.97 x10E6/uL (ref 3.77–5.28)
RDW: 12.6 % (ref 11.7–15.4)
WBC: 5.7 10*3/uL (ref 3.4–10.8)

## 2023-08-14 LAB — LIPID PANEL
Chol/HDL Ratio: 2.6 ratio (ref 0.0–4.4)
Cholesterol, Total: 139 mg/dL (ref 100–199)
HDL: 54 mg/dL (ref >39)
LDL Chol Calc (NIH): 71 mg/dL (ref 0–99)
Triglycerides: 69 mg/dL (ref 0–149)
VLDL Cholesterol Cal: 14 mg/dL (ref 5–40)

## 2023-08-14 LAB — CMP14+EGFR
ALT: 14 IU/L (ref 0–32)
AST: 18 IU/L (ref 0–40)
Albumin: 4.6 g/dL (ref 3.8–4.8)
Alkaline Phosphatase: 111 IU/L (ref 44–121)
BUN/Creatinine Ratio: 24 (ref 12–28)
BUN: 19 mg/dL (ref 8–27)
Bilirubin Total: 0.9 mg/dL (ref 0.0–1.2)
CO2: 26 mmol/L (ref 20–29)
Calcium: 9.6 mg/dL (ref 8.7–10.3)
Chloride: 100 mmol/L (ref 96–106)
Creatinine, Ser: 0.79 mg/dL (ref 0.57–1.00)
Globulin, Total: 2.6 g/dL (ref 1.5–4.5)
Glucose: 118 mg/dL — ABNORMAL HIGH (ref 70–99)
Potassium: 4.1 mmol/L (ref 3.5–5.2)
Sodium: 140 mmol/L (ref 134–144)
Total Protein: 7.2 g/dL (ref 6.0–8.5)
eGFR: 79 mL/min/{1.73_m2} (ref >59)

## 2023-08-14 LAB — BAYER DCA HB A1C WAIVED: HB A1C (BAYER DCA - WAIVED): 5.7 % — ABNORMAL HIGH (ref 4.8–5.6)

## 2023-08-14 MED ORDER — VALSARTAN-HYDROCHLOROTHIAZIDE 160-12.5 MG PO TABS
1.0000 | ORAL_TABLET | Freq: Every day | ORAL | 1 refills | Status: DC
Start: 2023-08-14 — End: 2024-02-16

## 2023-08-14 MED ORDER — ROSUVASTATIN CALCIUM 20 MG PO TABS
20.0000 mg | ORAL_TABLET | Freq: Every day | ORAL | 1 refills | Status: DC
Start: 2023-08-14 — End: 2024-02-16

## 2023-08-14 MED ORDER — DILTIAZEM HCL ER COATED BEADS 180 MG PO CP24
180.0000 mg | ORAL_CAPSULE | Freq: Every day | ORAL | 1 refills | Status: DC
Start: 2023-08-14 — End: 2024-02-16

## 2023-08-14 MED ORDER — SERTRALINE HCL 50 MG PO TABS
50.0000 mg | ORAL_TABLET | Freq: Every day | ORAL | 1 refills | Status: DC
Start: 2023-08-14 — End: 2024-02-16

## 2023-08-14 MED ORDER — APIXABAN 5 MG PO TABS
5.0000 mg | ORAL_TABLET | Freq: Two times a day (BID) | ORAL | 6 refills | Status: DC
Start: 2023-08-14 — End: 2024-02-16

## 2023-08-14 NOTE — Progress Notes (Signed)
Subjective:    Patient ID: Jean [name], female    DOB: 16-Nov-1951, 72 y.o.   MRN: [id number]   Chief Complaint: medical management of chronic issues     HPI:  Jean [name] is a 72 y.o. who identifies as a female who was assigned female at birth.   Social history: Lives with: husband Work history: retired from school system   Comes in today for follow up of the following chronic medical issues:  1. Primary hypertension No c/o chest pain, sob or headache. Does not check blood pressure at home. BP Readings from Last 3 Encounters:  04/24/23 128/74  02/12/23 132/76  11/03/22 113/67     2. Hyperlipidemia associated with type 2 diabetes mellitus (HCC) Does try to watch diet and walks for exercise. Exercise has slowed down some due to knee pain. Lab Results  Component Value Date   CHOL 135 02/12/2023   HDL 47 02/12/2023   LDLCALC 74 02/12/2023   TRIG 68 02/12/2023   CHOLHDL 2.9 02/12/2023   ;  3. Diet-controlled diabetes mellitus (HCC) Does not check her blood sugars at home. Lab Results  Component Value Date   HGBA1C 6.1 (H) 02/12/2023     4. Paroxysmal atrial fibrillation (HCC) 5. SVT (supraventricular tachycardia) I son eliquis with no bleeding issues. Cardizem is working well for rate control. Has had no episodes of SVT. Last saw cardiology on 04/24/23.  6. Recurrent major depressive disorder, in full remission (HCC) Has been on zoloft for  some time  now and is doing well.    08/14/2023    7:53 AM 02/12/2023    8:08 AM 11/03/2022   11:09 AM  Depression screen PHQ 2/9  Decreased Interest 0 0 0  Down, Depressed, Hopeless 0 0 0  PHQ - 2 Score 0 0 0  Altered sleeping 0 0 0  Tired, decreased energy 0 0 0  Change in appetite 0 0 0  Feeling bad or failure about yourself  0 0 0  Trouble concentrating 0 0 0  Moving slowly or fidgety/restless 0 0 0  Suicidal thoughts 0 0 0  PHQ-9 Score 0 0 0  Difficult doing work/chores Not difficult at all Not  difficult at all Not difficult at all     7. BMI 26.0-26.9,adult No recent weight changes. Wt Readings from Last 3 Encounters:  08/14/23 156 lb (70.8 kg)  04/24/23 155 lb (70.3 kg)  02/12/23 158 lb (71.7 kg)   BMI Readings from Last 3 Encounters:  08/14/23 25.18 kg/m  04/24/23 25.02 kg/m  02/12/23 25.50 kg/m      New complaints: None today  No Known Allergies Outpatient Encounter Medications as of 08/14/2023  Medication Sig   acetaminophen (TYLENOL) 325 MG tablet Take 2 tablets (650 mg total) by mouth every 6 (six) hours as needed.   apixaban (ELIQUIS) 5 MG TABS tablet Take 1 tablet (5 mg total) by mouth 2 (two) times daily.   Cholecalciferol (D3 2000) 50 MCG (2000 UT) CAPS Take 1 capsule by mouth daily.   diltiazem (CARDIZEM CD) 180 MG 24 hr capsule Take 1 capsule (180 mg total) by mouth daily.   Fexofenadine HCl (ALLEGRA PO) Take 1 tablet by mouth daily as needed.    Multiple Vitamins-Minerals (PRESERVISION AREDS 2) CAPS SMARTSIG:1 Tablet(s) By Mouth   Prednisol Ace-Moxiflox-Bromfen 1-0.5-0.075 % SUSP Place 1 drop into the left eye 4 (four) times daily.   Propylene Glycol (SYSTANE COMPLETE OP) Apply 1 drop to eye 4 (four)  times daily.   rosuvastatin (CRESTOR) 20 MG tablet Take 1 tablet (20 mg total) by mouth daily.   sertraline (ZOLOFT) 50 MG tablet Take 1 tablet (50 mg total) by mouth daily.   valsartan-hydrochlorothiazide (DIOVAN-HCT) 160-12.5 MG tablet Take 1 tablet by mouth daily.   No facility-administered encounter medications on file as of 08/14/2023.    Past Surgical History:  Procedure Laterality Date   BASAL CELL CARCINOMA EXCISION  2021   One on top of head and one on right arm   US ECHOCARDIOGRAPHY     EF 60%    Family History  Problem Relation Age of Onset   Heart disease Mother    Hypertension Sister    Diabetes Sister    Anemia Paternal Grandmother       Controlled substance contract: n/a     Review of Systems  Constitutional:   Negative for diaphoresis.  Eyes:  Negative for pain.  Respiratory:  Negative for shortness of breath.   Cardiovascular:  Negative for chest pain, palpitations and leg swelling.  Gastrointestinal:  Negative for abdominal pain.  Endocrine: Negative for polydipsia.  Skin:  Negative for rash.  Neurological:  Negative for dizziness, weakness and headaches.  Hematological:  Does not bruise/bleed easily.  All other systems reviewed and are negative.      Objective:   Physical Exam Vitals and nursing note reviewed.  Constitutional:      General: She is not in acute distress.    Appearance: Normal appearance. She is well-developed.  HENT:     Head: Normocephalic.     Right Ear: Tympanic membrane normal.     Left Ear: Tympanic membrane normal.     Nose: Nose normal.     Mouth/Throat:     Mouth: Mucous membranes are moist.  Eyes:     Pupils: Pupils are equal, round, and reactive to light.  Neck:     Vascular: No carotid bruit or JVD.  Cardiovascular:     Rate and Rhythm: Normal rate and regular rhythm.     Heart sounds: Normal heart sounds.  Pulmonary:     Effort: Pulmonary effort is normal. No respiratory distress.     Breath sounds: Normal breath sounds. No wheezing or rales.  Chest:     Chest wall: No tenderness.  Abdominal:     General: Bowel sounds are normal. There is no distension or abdominal bruit.     Palpations: Abdomen is soft. There is no hepatomegaly, splenomegaly, mass or pulsatile mass.     Tenderness: There is no abdominal tenderness.  Musculoskeletal:        General: Normal range of motion.     Cervical back: Normal range of motion and neck supple.  Lymphadenopathy:     Cervical: No cervical adenopathy.  Skin:    General: Skin is warm and dry.  Neurological:     Mental Status: She is alert and oriented to person, place, and time.     Deep Tendon Reflexes: Reflexes are normal and symmetric.  Psychiatric:        Behavior: Behavior normal.        Thought  Content: Thought content normal.        Judgment: Judgment normal.    BP 130/79   Pulse 99   Temp 97.6 F (36.4 C) (Temporal)   Resp 20   Ht 5\' 6"  (1.676 m)   Wt 156 lb (70.8 kg)   SpO2 96%   BMI 25.18 kg/m   Hgba1c 5.7%  Assessment & Plan:   Jean [name] comes in today with chief complaint of Medical Management of Chronic Issues   Diagnosis and orders addressed:  1. Primary hypertension Low sodium diet - CBC with Differential/Platelet - CMP14+EGFR - valsartan-hydrochlorothiazide (DIOVAN-HCT) 160-12.5 MG tablet; Take 1 tablet by mouth daily.  Dispense: 90 tablet; Refill: 1  2. Hyperlipidemia associated with type 2 diabetes mellitus (HCC) Low fat diet - Lipid panel - rosuvastatin (CRESTOR) 20 MG tablet; Take 1 tablet (20 mg total) by mouth daily.  Dispense: 90 tablet; Refill: 1  3. Diet-controlled diabetes mellitus (HCC) Continue to watch carbs in diet - Bayer DCA Hb A1c Waived - Microalbumin / creatinine urine ratio  4. Paroxysmal atrial fibrillation (HCC) 5. SVT (supraventricular tachycardia) Avoid caffeine - apixaban (ELIQUIS) 5 MG TABS tablet; Take 1 tablet (5 mg total) by mouth 2 (two) times daily.  Dispense: 60 tablet; Refill: 6 - diltiazem (CARDIZEM CD) 180 MG 24 hr capsule; Take 1 capsule (180 mg total) by mouth daily.  Dispense: 90 capsule; Refill: 1  6. Recurrent major depressive disorder, in full remission (HCC) Stress management - sertraline (ZOLOFT) 50 MG tablet; Take 1 tablet (50 mg total) by mouth daily.  Dispense: 90 tablet; Refill: 1  7. BMI 26.0-26.9,adult Discussed diet and exercise for person with BMI >25 Will recheck weight in 3-6 months   Labs pending Health Maintenance reviewed Diet and exercise encouraged  Follow up plan: 6 months   Mary-Margaret Daphine Deutscher, FNP

## 2023-08-14 NOTE — Patient Instructions (Signed)

## 2023-08-15 LAB — MICROALBUMIN / CREATININE URINE RATIO
Creatinine, Urine: 139.5 mg/dL
Microalb/Creat Ratio: 26 mg/g{creat} (ref 0–29)
Microalbumin, Urine: 35.6 ug/mL

## 2023-08-19 DIAGNOSIS — M25561 Pain in right knee: Secondary | ICD-10-CM | POA: Diagnosis not present

## 2023-08-25 DIAGNOSIS — M25561 Pain in right knee: Secondary | ICD-10-CM | POA: Diagnosis not present

## 2023-09-30 DIAGNOSIS — L814 Other melanin hyperpigmentation: Secondary | ICD-10-CM | POA: Diagnosis not present

## 2023-09-30 DIAGNOSIS — L579 Skin changes due to chronic exposure to nonionizing radiation, unspecified: Secondary | ICD-10-CM | POA: Diagnosis not present

## 2023-09-30 DIAGNOSIS — D485 Neoplasm of uncertain behavior of skin: Secondary | ICD-10-CM | POA: Diagnosis not present

## 2023-09-30 DIAGNOSIS — L821 Other seborrheic keratosis: Secondary | ICD-10-CM | POA: Diagnosis not present

## 2023-09-30 DIAGNOSIS — L820 Inflamed seborrheic keratosis: Secondary | ICD-10-CM | POA: Diagnosis not present

## 2023-09-30 DIAGNOSIS — Z85828 Personal history of other malignant neoplasm of skin: Secondary | ICD-10-CM | POA: Diagnosis not present

## 2023-09-30 DIAGNOSIS — D235 Other benign neoplasm of skin of trunk: Secondary | ICD-10-CM | POA: Diagnosis not present

## 2023-09-30 DIAGNOSIS — D2239 Melanocytic nevi of other parts of face: Secondary | ICD-10-CM | POA: Diagnosis not present

## 2023-10-06 ENCOUNTER — Other Ambulatory Visit: Payer: Self-pay | Admitting: Nurse Practitioner

## 2023-10-06 DIAGNOSIS — Z1231 Encounter for screening mammogram for malignant neoplasm of breast: Secondary | ICD-10-CM

## 2023-10-16 ENCOUNTER — Ambulatory Visit
Admission: RE | Admit: 2023-10-16 | Discharge: 2023-10-16 | Disposition: A | Discharge status: Home / Self Care | Payer: Medicare PPO | Source: Ambulatory Visit | Attending: Nurse Practitioner | Admitting: Nurse Practitioner

## 2023-10-16 DIAGNOSIS — Z1231 Encounter for screening mammogram for malignant neoplasm of breast: Secondary | ICD-10-CM

## 2023-11-11 DIAGNOSIS — H0288A Meibomian gland dysfunction right eye, upper and lower eyelids: Secondary | ICD-10-CM | POA: Diagnosis not present

## 2023-11-11 DIAGNOSIS — H0288B Meibomian gland dysfunction left eye, upper and lower eyelids: Secondary | ICD-10-CM | POA: Diagnosis not present

## 2023-11-11 DIAGNOSIS — H353131 Nonexudative age-related macular degeneration, bilateral, early dry stage: Secondary | ICD-10-CM | POA: Diagnosis not present

## 2023-11-11 DIAGNOSIS — Z961 Presence of intraocular lens: Secondary | ICD-10-CM | POA: Diagnosis not present

## 2023-11-11 DIAGNOSIS — H0102A Squamous blepharitis right eye, upper and lower eyelids: Secondary | ICD-10-CM | POA: Diagnosis not present

## 2023-11-11 DIAGNOSIS — H26493 Other secondary cataract, bilateral: Secondary | ICD-10-CM | POA: Diagnosis not present

## 2023-11-11 DIAGNOSIS — H04123 Dry eye syndrome of bilateral lacrimal glands: Secondary | ICD-10-CM | POA: Diagnosis not present

## 2023-11-11 DIAGNOSIS — H0102B Squamous blepharitis left eye, upper and lower eyelids: Secondary | ICD-10-CM | POA: Diagnosis not present

## 2023-12-11 ENCOUNTER — Encounter: Payer: Self-pay | Admitting: Nurse Practitioner

## 2023-12-11 NOTE — Telephone Encounter (Signed)
 Care team updated and letter sent for eye exam notes.

## 2024-02-16 ENCOUNTER — Encounter: Payer: Self-pay | Admitting: Nurse Practitioner

## 2024-02-16 ENCOUNTER — Ambulatory Visit: Payer: Medicare PPO | Admitting: Nurse Practitioner

## 2024-02-16 VITALS — BP 135/82 | HR 95 | Temp 97.6°F | Ht 66.0 in | Wt 156.0 lb

## 2024-02-16 DIAGNOSIS — I480 Paroxysmal atrial fibrillation: Secondary | ICD-10-CM | POA: Diagnosis not present

## 2024-02-16 DIAGNOSIS — Z6826 Body mass index (BMI) 26.0-26.9, adult: Secondary | ICD-10-CM | POA: Diagnosis not present

## 2024-02-16 DIAGNOSIS — E119 Type 2 diabetes mellitus without complications: Secondary | ICD-10-CM | POA: Diagnosis not present

## 2024-02-16 DIAGNOSIS — I10 Essential (primary) hypertension: Secondary | ICD-10-CM

## 2024-02-16 DIAGNOSIS — E785 Hyperlipidemia, unspecified: Secondary | ICD-10-CM | POA: Diagnosis not present

## 2024-02-16 DIAGNOSIS — E1169 Type 2 diabetes mellitus with other specified complication: Secondary | ICD-10-CM

## 2024-02-16 DIAGNOSIS — I471 Supraventricular tachycardia, unspecified: Secondary | ICD-10-CM | POA: Diagnosis not present

## 2024-02-16 DIAGNOSIS — F3342 Major depressive disorder, recurrent, in full remission: Secondary | ICD-10-CM

## 2024-02-16 LAB — LIPID PANEL

## 2024-02-16 LAB — BAYER DCA HB A1C WAIVED: HB A1C (BAYER DCA - WAIVED): 5.8 % — ABNORMAL HIGH (ref 4.8–5.6)

## 2024-02-16 MED ORDER — ROSUVASTATIN CALCIUM 20 MG PO TABS: 20.0000 mg | ORAL_TABLET | Freq: Every day | ORAL | 1 refills | Status: DC

## 2024-02-16 MED ORDER — DILTIAZEM HCL ER COATED BEADS 180 MG PO CP24: 180.0000 mg | ORAL_CAPSULE | Freq: Every day | ORAL | 1 refills | Status: DC

## 2024-02-16 MED ORDER — SERTRALINE HCL 50 MG PO TABS: 50.0000 mg | ORAL_TABLET | Freq: Every day | ORAL | 1 refills | Status: DC

## 2024-02-16 MED ORDER — APIXABAN 5 MG PO TABS: 5.0000 mg | ORAL_TABLET | Freq: Two times a day (BID) | ORAL | 6 refills | Status: AC

## 2024-02-16 MED ORDER — VALSARTAN-HYDROCHLOROTHIAZIDE 160-12.5 MG PO TABS: 1.0000 | ORAL_TABLET | Freq: Every day | ORAL | 1 refills | Status: DC

## 2024-02-16 NOTE — Patient Instructions (Signed)
Atrial Fibrillation Atrial fibrillation (AFib) is a type of heartbeat that is irregular or fast. If you have AFib, your heart beats without any order. This makes it hard for your heart to pump blood in a normal way. AFib may come and go, or it may become a long-lasting problem. If AFib is not treated, it can put you at higher risk for stroke, heart failure, and other heart problems. What are the causes? AFib may be caused by diseases that damage the heart's electrical system. They include: High blood pressure. Heart failure. Heart valve diseases. Heart surgery. Diabetes. Thyroid disease. Kidney disease. Lung diseases, such as pneumonia or COPD. Sleep apnea. Sometimes the cause is not known. What increases the risk? You are more likely to develop AFib if: You are older. You exercise often and very hard. You have a family history of AFib. You are female. You are Caucasian. You are overweight. You smoke. You drink a lot of alcohol. What are the signs or symptoms? Common symptoms of this condition include: A feeling that your heart is beating very fast. Chest pain or discomfort. Feeling short of breath. Suddenly feeling light-headed or weak. Getting tired easily during activity. Fainting. Sweating. In some cases, there are no symptoms. How is this treated? Medicines to: Prevent blood clots. Treat heart rate or heart rhythm problems. Using devices, such as a pacemaker, to correct heart rhythm problems. Doing surgery to remove the part of the heart that sends bad signals. Closing an area where clots can form in the heart (left atrial appendage). In some cases, your doctor will treat other underlying conditions. Follow these instructions at home: Medicines Take over-the-counter and prescription medicines only as told by your doctor. Do not take any new medicines without first talking to your doctor. If you are taking blood thinners: Talk with your doctor before taking aspirin  or NSAIDs, such as ibuprofen. Take your medicines as told. Take them at the same time each day. Do not do things that could hurt or bruise you. Be careful to avoid falls. Wear an alert bracelet or carry a card that says you take blood thinners. Lifestyle Do not smoke or use any products that contain nicotine or tobacco. If you need help quitting, ask your doctor. Eat heart-healthy foods. Talk with your doctor about the right eating plan for you. Exercise regularly as told by your doctor. Do not drink alcohol. Lose weight if you are overweight. General instructions If you have sleep apnea, treat it as told by your doctor. Do not use diet pills unless your doctor says they are safe for you. Diet pills may make heart problems worse. Keep all follow-up visits. Your doctor will check your heart rate and rhythm regularly. Contact a doctor if: You notice a change in the speed, rhythm, or strength of your heartbeat. You are taking a blood-thinning medicine and you get more bruising. You get tired more easily when you move or exercise. You have a sudden change in weight. Get help right away if:  You have pain in your chest. You have trouble breathing. You have side effects of blood thinners, such as blood in your vomit, poop (stool), or pee (urine), or bleeding that cannot stop. You have any signs of a stroke. "BE FAST" is an easy way to remember the main warning signs: B - Balance. Dizziness, sudden trouble walking, or loss of balance. E - Eyes. Trouble seeing or a change in how you see. F - Face. Sudden weakness or loss of  feeling in the face. The face or eyelid may droop on one side. A - Arms.Weakness or loss of feeling in an arm. This happens suddenly and usually on one side of the body. S - Speech. Sudden trouble speaking, slurred speech, or trouble understanding what people say. T - Time.Time to call emergency services. Write down what time symptoms started. You have other signs of a  stroke, such as: A sudden, very bad headache with no known cause. Feeling like you may vomit (nausea). Vomiting. A seizure. These symptoms may be an emergency. Get help right away. Call 911. Do not wait to see if the symptoms will go away. Do not drive yourself to the hospital. This information is not intended to replace advice given to you by your health care provider. Make sure you discuss any questions you have with your health care provider. Document Revised: 08/27/2022 Document Reviewed: 08/27/2022 Elsevier Patient Education  2024 [employer].

## 2024-02-16 NOTE — Progress Notes (Signed)
 Subjective:    Patient ID: Jean [name], female    DOB: 27-Aug-1951, 73 y.o.   MRN: [id number]   Chief Complaint: medical management of chronic issues     HPI:  Jean [name] is a 73 y.o. who identifies as a female who was assigned female at birth.   Social history: Lives with: husband Work history: retired from school system   Comes in today for follow up of the following chronic medical issues:  1. Primary hypertension No c/o chest pain, sob or headache. Does not check blood pressure at home. BP Readings from Last 3 Encounters:  08/14/23 130/79  04/24/23 128/74  02/12/23 132/76     2. Hyperlipidemia associated with type 2 diabetes mellitus (HCC) Does try to watch diet and walks for exercise. Exercise has slowed down some due to knee pain. Lab Results  Component Value Date   CHOL 139 08/14/2023   HDL 54 08/14/2023   LDLCALC 71 08/14/2023   TRIG 69 08/14/2023   CHOLHDL 2.6 08/14/2023   ;  3. Diet-controlled diabetes mellitus (HCC) Does not check her blood sugars at home. Lab Results  Component Value Date   HGBA1C 5.7 (H) 08/14/2023     4. Paroxysmal atrial fibrillation (HCC) 5. SVT (supraventricular tachycardia) I son eliquis with no bleeding issues. Cardizem is working well for rate control. Has had no episodes of SVT. Last saw cardiology on 04/24/23.  6. Recurrent major depressive disorder, in full remission (HCC) Has been on zoloft for  some time  now and is doing well.    02/16/2024    8:05 AM 08/14/2023    7:53 AM 02/12/2023    8:08 AM  Depression screen PHQ 2/9  Decreased Interest 0 0 0  Down, Depressed, Hopeless 0 0 0  PHQ - 2 Score 0 0 0  Altered sleeping 0 0 0  Tired, decreased energy 0 0 0  Change in appetite 0 0 0  Feeling bad or failure about yourself  0 0 0  Trouble concentrating 0 0 0  Moving slowly or fidgety/restless 0 0 0  Suicidal thoughts 0 0 0  PHQ-9 Score 0 0 0  Difficult doing work/chores Not difficult at all Not  difficult at all Not difficult at all      7. BMI 26.0-26.9,adult No recent weight changes. Wt Readings from Last 3 Encounters:  02/16/24 156 lb (70.8 kg)  08/14/23 156 lb (70.8 kg)  04/24/23 155 lb (70.3 kg)   BMI Readings from Last 3 Encounters:  02/16/24 25.18 kg/m  08/14/23 25.18 kg/m  04/24/23 25.02 kg/m        New complaints: None today  No Known Allergies Outpatient Encounter Medications as of 02/16/2024  Medication Sig   acetaminophen (TYLENOL) 325 MG tablet Take 2 tablets (650 mg total) by mouth every 6 (six) hours as needed.   apixaban (ELIQUIS) 5 MG TABS tablet Take 1 tablet (5 mg total) by mouth 2 (two) times daily.   Cholecalciferol (D3 2000) 50 MCG (2000 UT) CAPS Take 1 capsule by mouth daily.   diltiazem (CARDIZEM CD) 180 MG 24 hr capsule Take 1 capsule (180 mg total) by mouth daily.   Fexofenadine HCl (ALLEGRA PO) Take 1 tablet by mouth daily as needed.    Multiple Vitamins-Minerals (PRESERVISION AREDS 2) CAPS SMARTSIG:1 Tablet(s) By Mouth   Propylene Glycol (SYSTANE COMPLETE OP) Apply 1 drop to eye 4 (four) times daily.   rosuvastatin (CRESTOR) 20 MG tablet Take 1 tablet (20 mg  total) by mouth daily.   sertraline (ZOLOFT) 50 MG tablet Take 1 tablet (50 mg total) by mouth daily.   valsartan-hydrochlorothiazide (DIOVAN-HCT) 160-12.5 MG tablet Take 1 tablet by mouth daily.   No facility-administered encounter medications on file as of 02/16/2024.    Past Surgical History:  Procedure Laterality Date   BASAL CELL CARCINOMA EXCISION  2021   One on top of head and one on right arm   US ECHOCARDIOGRAPHY     EF 60%    Family History  Problem Relation Age of Onset   Heart disease Mother    Hypertension Sister    Diabetes Sister    Anemia Paternal Grandmother       Controlled substance contract: n/a     Review of Systems  Constitutional:  Negative for diaphoresis.  Eyes:  Negative for pain.  Respiratory:  Negative for shortness of breath.    Cardiovascular:  Negative for chest pain, palpitations and leg swelling.  Gastrointestinal:  Negative for abdominal pain.  Endocrine: Negative for polydipsia.  Skin:  Negative for rash.  Neurological:  Negative for dizziness, weakness and headaches.  Hematological:  Does not bruise/bleed easily.  All other systems reviewed and are negative.      Objective:   Physical Exam Vitals and nursing note reviewed.  Constitutional:      General: She is not in acute distress.    Appearance: Normal appearance. She is well-developed.  HENT:     Head: Normocephalic.     Right Ear: Tympanic membrane normal.     Left Ear: Tympanic membrane normal.     Nose: Nose normal.     Mouth/Throat:     Mouth: Mucous membranes are moist.  Eyes:     Pupils: Pupils are equal, round, and reactive to light.  Neck:     Vascular: No carotid bruit or JVD.  Cardiovascular:     Rate and Rhythm: Normal rate and regular rhythm.     Heart sounds: Normal heart sounds.  Pulmonary:     Effort: Pulmonary effort is normal. No respiratory distress.     Breath sounds: Normal breath sounds. No wheezing or rales.  Chest:     Chest wall: No tenderness.  Abdominal:     General: Bowel sounds are normal. There is no distension or abdominal bruit.     Palpations: Abdomen is soft. There is no hepatomegaly, splenomegaly, mass or pulsatile mass.     Tenderness: There is no abdominal tenderness.  Musculoskeletal:        General: Normal range of motion.     Cervical back: Normal range of motion and neck supple.  Lymphadenopathy:     Cervical: No cervical adenopathy.  Skin:    General: Skin is warm and dry.  Neurological:     Mental Status: She is alert and oriented to person, place, and time.     Deep Tendon Reflexes: Reflexes are normal and symmetric.  Psychiatric:        Behavior: Behavior normal.        Thought Content: Thought content normal.        Judgment: Judgment normal.    BP 135/82   Pulse 95   Temp 97.6  F (36.4 C) (Temporal)   Ht 5\' 6"  (1.676 m)   Wt 156 lb (70.8 kg)   SpO2 98%   BMI 25.18 kg/m    Hgba1c 5.8%      Assessment & Plan:   Jean [name] comes in today with  chief complaint of No chief complaint on file.   Diagnosis and orders addressed:  1. Primary hypertension Low sodium diet - CBC with Differential/Platelet - CMP14+EGFR - valsartan-hydrochlorothiazide (DIOVAN-HCT) 160-12.5 MG tablet; Take 1 tablet by mouth daily.  Dispense: 90 tablet; Refill: 1  2. Hyperlipidemia associated with type 2 diabetes mellitus (HCC) Low fat diet - Lipid panel - rosuvastatin (CRESTOR) 20 MG tablet; Take 1 tablet (20 mg total) by mouth daily.  Dispense: 90 tablet; Refill: 1  3. Diet-controlled diabetes mellitus (HCC) Continue to watch carbs in diet - Bayer DCA Hb A1c Waived - Microalbumin / creatinine urine ratio  4. Paroxysmal atrial fibrillation (HCC) Report any palpitations  5. SVT (supraventricular tachycardia) Avoid caffeine - apixaban (ELIQUIS) 5 MG TABS tablet; Take 1 tablet (5 mg total) by mouth 2 (two) times daily.  Dispense: 60 tablet; Refill: 6 - diltiazem (CARDIZEM CD) 180 MG 24 hr capsule; Take 1 capsule (180 mg total) by mouth daily.  Dispense: 90 capsule; Refill: 1  6. Recurrent major depressive disorder, in full remission (HCC) Stress management - sertraline (ZOLOFT) 50 MG tablet; Take 1 tablet (50 mg total) by mouth daily.  Dispense: 90 tablet; Refill: 1  7. BMI 26.0-26.9,adult Discussed diet and exercise for person with BMI >25 Will recheck weight in 3-6 months   Labs pending Health Maintenance reviewed Diet and exercise encouraged  Follow up plan: 6 months   Mary-Margaret Daphine Deutscher, FNP

## 2024-02-17 LAB — CMP14+EGFR
ALT: 15 [IU]/L (ref 0–32)
AST: 21 [IU]/L (ref 0–40)
Albumin: 4.4 g/dL (ref 3.8–4.8)
Alkaline Phosphatase: 107 [IU]/L (ref 44–121)
BUN/Creatinine Ratio: 16 (ref 12–28)
BUN: 12 mg/dL (ref 8–27)
Bilirubin Total: 0.9 mg/dL (ref 0.0–1.2)
CO2: 26 mmol/L (ref 20–29)
Calcium: 9.5 mg/dL (ref 8.7–10.3)
Chloride: 101 mmol/L (ref 96–106)
Creatinine, Ser: 0.75 mg/dL (ref 0.57–1.00)
Globulin, Total: 2.7 g/dL (ref 1.5–4.5)
Glucose: 113 mg/dL — ABNORMAL HIGH (ref 70–99)
Potassium: 4.3 mmol/L (ref 3.5–5.2)
Sodium: 142 mmol/L (ref 134–144)
Total Protein: 7.1 g/dL (ref 6.0–8.5)
eGFR: 84 mL/min/{1.73_m2}

## 2024-02-17 LAB — LIPID PANEL
Chol/HDL Ratio: 3 ratio (ref 0.0–4.4)
Cholesterol, Total: 146 mg/dL (ref 100–199)
HDL: 49 mg/dL
LDL Chol Calc (NIH): 81 mg/dL (ref 0–99)
Triglycerides: 81 mg/dL (ref 0–149)
VLDL Cholesterol Cal: 16 mg/dL (ref 5–40)

## 2024-02-17 LAB — CBC WITH DIFFERENTIAL/PLATELET
Basophils Absolute: 0 10*3/uL (ref 0.0–0.2)
Basos: 0 %
EOS (ABSOLUTE): 0.1 10*3/uL (ref 0.0–0.4)
Eos: 3 %
Hematocrit: 42.2 % (ref 34.0–46.6)
Hemoglobin: 14.3 g/dL (ref 11.1–15.9)
Immature Grans (Abs): 0 10*3/uL (ref 0.0–0.1)
Immature Granulocytes: 0 %
Lymphocytes Absolute: 0.8 10*3/uL (ref 0.7–3.1)
Lymphs: 15 %
MCH: 30.6 pg (ref 26.6–33.0)
MCHC: 33.9 g/dL (ref 31.5–35.7)
MCV: 90 fL (ref 79–97)
Monocytes Absolute: 0.6 10*3/uL (ref 0.1–0.9)
Monocytes: 10 %
Neutrophils Absolute: 4.1 10*3/uL (ref 1.4–7.0)
Neutrophils: 72 %
Platelets: 128 10*3/uL — ABNORMAL LOW (ref 150–450)
RBC: 4.68 x10E6/uL (ref 3.77–5.28)
RDW: 12.5 % (ref 11.7–15.4)
WBC: 5.6 10*3/uL (ref 3.4–10.8)

## 2024-04-06 DIAGNOSIS — D225 Melanocytic nevi of trunk: Secondary | ICD-10-CM | POA: Diagnosis not present

## 2024-04-06 DIAGNOSIS — Z85828 Personal history of other malignant neoplasm of skin: Secondary | ICD-10-CM | POA: Diagnosis not present

## 2024-04-06 DIAGNOSIS — L814 Other melanin hyperpigmentation: Secondary | ICD-10-CM | POA: Diagnosis not present

## 2024-04-06 DIAGNOSIS — L579 Skin changes due to chronic exposure to nonionizing radiation, unspecified: Secondary | ICD-10-CM | POA: Diagnosis not present

## 2024-04-06 DIAGNOSIS — L570 Actinic keratosis: Secondary | ICD-10-CM | POA: Diagnosis not present

## 2024-04-06 DIAGNOSIS — L821 Other seborrheic keratosis: Secondary | ICD-10-CM | POA: Diagnosis not present

## 2024-04-06 DIAGNOSIS — D235 Other benign neoplasm of skin of trunk: Secondary | ICD-10-CM | POA: Diagnosis not present

## 2024-04-20 NOTE — Progress Notes (Signed)
 Cullen Dose Date of Birth: 06-06-1951   History of Present Illness: Jean [name] is seen today for yearly followup. She has a history of SVT in the past and PVCs. Echo in 2012 showed normal LV function. Event monitor then showed predominantly PVCs with only a 4 beat run of PAT.   She was seen by Ervin Heath PA-C for a televisit in May with complaints of increased palpitations. Zio monitor was placed. This demonstrated Afib with RVR rate ranging from 64-196. Burden 11%. Average HR 124. Nonsustained runs of atrial tachycardia. Metoprolol  increased to 25 mg bid. Started on Eliquis  and diltiazem . ASA discontinued. Echo showed normal EF with mild AI and mild LVH.   On follow up today she states she can only tell she is in Afib by checking her pulse. Otherwise no chest pain, dyspnea, palpitations or dizziness. No bleeding.    Current Outpatient Medications on File Prior to Visit  Medication Sig Dispense Refill   acetaminophen  (TYLENOL ) 325 MG tablet Take 2 tablets (650 mg total) by mouth every 6 (six) hours as needed. 60 tablet 1   apixaban  (ELIQUIS ) 5 MG TABS tablet Take 1 tablet (5 mg total) by mouth 2 (two) times daily. 60 tablet 6   Cholecalciferol (D3 2000) 50 MCG (2000 UT) CAPS Take 1 capsule by mouth daily.     diltiazem  (CARDIZEM  CD) 180 MG 24 hr capsule Take 1 capsule (180 mg total) by mouth daily. 90 capsule 1   Fexofenadine HCl (ALLEGRA PO) Take 1 tablet by mouth daily as needed.      Multiple Vitamins-Minerals (PRESERVISION AREDS 2) CAPS SMARTSIG:1 Tablet(s) By Mouth     Propylene Glycol (SYSTANE COMPLETE OP) Apply 1 drop to eye 4 (four) times daily.     rosuvastatin  (CRESTOR ) 20 MG tablet Take 1 tablet (20 mg total) by mouth daily. 90 tablet 1   sertraline  (ZOLOFT ) 50 MG tablet Take 1 tablet (50 mg total) by mouth daily. 90 tablet 1   valsartan -hydrochlorothiazide  (DIOVAN -HCT) 160-12.5 MG tablet Take 1 tablet by mouth daily. 90 tablet 1   No current facility-administered  medications on file prior to visit.    No Known Allergies  Past Medical History:  Diagnosis Date   Gestational diabetes    WITH HER CHILDBIRTH   Hyperlipidemia    Hypertension    Mitral insufficiency    TRIVIAL   Palpitations    PVC's (premature ventricular contractions) 03/16/2014   SVT (supraventricular tachycardia) (HCC)     Past Surgical History:  Procedure Laterality Date   BASAL CELL CARCINOMA EXCISION  2021   One on top of head and one on right arm   US  ECHOCARDIOGRAPHY     EF 60%    Social History   Tobacco Use  Smoking Status Never  Smokeless Tobacco Never    Social History   Substance and Sexual Activity  Alcohol Use No    Family History  Problem Relation Age of Onset   Heart disease Mother    Hypertension Sister    Diabetes Sister    Anemia Paternal Grandmother     Review of Systems: As noted in HPI. All other systems were reviewed and are negative.  Physical Exam: BP (!) 146/86 (BP Location: Right Arm, Cuff Size: Normal)   Pulse 70   Ht 5\' 6"  (1.676 m)   Wt 159 lb 6.4 oz (72.3 kg)   SpO2 97%   BMI 25.73 kg/m  GENERAL:  Well appearing WF in NAD HEENT:  PERRL,  EOMI, sclera are clear. Oropharynx is clear. NECK:  No jugular venous distention, carotid upstroke brisk and symmetric, no bruits, no thyromegaly or adenopathy LUNGS:  Clear to auscultation bilaterally CHEST:  Unremarkable HEART:  IRRR,  PMI not displaced or sustained,S1 and S2 within normal limits, no S3, no S4: no clicks, no rubs, no murmurs ABD:  Soft, nontender. BS +, no masses or bruits. No hepatomegaly, no splenomegaly EXT:  2 + pulses throughout, no edema, no cyanosis no clubbing SKIN:  Warm and dry.  No rashes NEURO:  Alert and oriented x 3. Cranial nerves II through XII intact. PSYCH:  Cognitively intact    LABORATORY DATA:  Lab Results  Component Value Date   WBC 5.6 02/16/2024   HGB 14.3 02/16/2024   HCT 42.2 02/16/2024   PLT 128 (L) 02/16/2024   GLUCOSE 113 (H)  02/16/2024   CHOL 146 02/16/2024   TRIG 81 02/16/2024   HDL 49 02/16/2024   LDLCALC 81 02/16/2024   ALT 15 02/16/2024   AST 21 02/16/2024   NA 142 02/16/2024   K 4.3 02/16/2024   CL 101 02/16/2024   CREATININE 0.75 02/16/2024   BUN 12 02/16/2024   CO2 26 02/16/2024   TSH 1.470 02/12/2023   HGBA1C 5.8 (H) 02/16/2024   EKG Interpretation Date/Time:  Monday Apr 25 2024 11:07:44 EDT Ventricular Rate:  79 PR Interval:    QRS Duration:  80 QT Interval:  380 QTC Calculation: 435 R Axis:   17  Text Interpretation: Atrial fibrillation  No significant change since last tracing  Confirmed by [place], Rabiah Goeser [phone]) on 04/25/2024 11:11:15 AM     Echo 07/04/19: IMPRESSIONS      1. The left ventricle has normal systolic function with an ejection fraction of 60-65%. The cavity size was normal. There is mildly increased left ventricular wall thickness. Left ventricular diastolic Doppler parameters are indeterminate.  2. The right ventricle has normal systolic function. The cavity was normal. There is no increase in right ventricular wall thickness.  3. The aortic valve is tricuspid. Mild thickening of the aortic valve. Aortic valve regurgitation is mild by color flow Doppler.  Zio monitor: 06/28/19: Study Highlights  NSR Atrial fibrillation with RVR. Burden 11%. HR range 64-196 with average 124 bpm Nonsustained runs of atrial tachycardia.     Assessment / Plan: 1. Paroxysmal Afib - persistent at least for the past year. Asymptomatic.  On Eliquis . [place] vasc score of 3.   Doing well on diltizazem. We will continue rate control strategy. Follow up in one year.   2.  Hypertension, BP is elevated today but prior readings have been good. Continue healthy diet and exercise and just monitor BP at home for now  3. Hyperlipidemia. On Crestor . LDL 81   Follow up in one year

## 2024-04-25 ENCOUNTER — Ambulatory Visit: Payer: Medicare PPO | Attending: Cardiology | Admitting: Cardiology

## 2024-04-25 ENCOUNTER — Encounter: Payer: Self-pay | Admitting: Cardiology

## 2024-04-25 VITALS — BP 146/86 | HR 70 | Ht 66.0 in | Wt 159.4 lb

## 2024-04-25 DIAGNOSIS — I10 Essential (primary) hypertension: Secondary | ICD-10-CM

## 2024-04-25 DIAGNOSIS — E785 Hyperlipidemia, unspecified: Secondary | ICD-10-CM

## 2024-04-25 DIAGNOSIS — I480 Paroxysmal atrial fibrillation: Secondary | ICD-10-CM | POA: Diagnosis not present

## 2024-04-25 NOTE — Patient Instructions (Signed)

## 2024-05-30 DIAGNOSIS — S80261A Insect bite (nonvenomous), right knee, initial encounter: Secondary | ICD-10-CM | POA: Diagnosis not present

## 2024-05-30 DIAGNOSIS — T148XXA Other injury of unspecified body region, initial encounter: Secondary | ICD-10-CM | POA: Diagnosis not present

## 2024-08-15 ENCOUNTER — Ambulatory Visit: Payer: Medicare PPO | Admitting: Nurse Practitioner

## 2024-08-15 ENCOUNTER — Encounter: Payer: Self-pay | Admitting: Nurse Practitioner

## 2024-08-15 VITALS — BP 134/84 | HR 84 | Temp 97.1°F | Ht 66.0 in | Wt 159.0 lb

## 2024-08-15 DIAGNOSIS — E785 Hyperlipidemia, unspecified: Secondary | ICD-10-CM | POA: Diagnosis not present

## 2024-08-15 DIAGNOSIS — E119 Type 2 diabetes mellitus without complications: Secondary | ICD-10-CM | POA: Diagnosis not present

## 2024-08-15 DIAGNOSIS — Z0000 Encounter for general adult medical examination without abnormal findings: Secondary | ICD-10-CM

## 2024-08-15 DIAGNOSIS — I471 Supraventricular tachycardia, unspecified: Secondary | ICD-10-CM | POA: Diagnosis not present

## 2024-08-15 DIAGNOSIS — I480 Paroxysmal atrial fibrillation: Secondary | ICD-10-CM

## 2024-08-15 DIAGNOSIS — Z0001 Encounter for general adult medical examination with abnormal findings: Secondary | ICD-10-CM | POA: Diagnosis not present

## 2024-08-15 DIAGNOSIS — F3342 Major depressive disorder, recurrent, in full remission: Secondary | ICD-10-CM | POA: Diagnosis not present

## 2024-08-15 DIAGNOSIS — Z6826 Body mass index (BMI) 26.0-26.9, adult: Secondary | ICD-10-CM

## 2024-08-15 DIAGNOSIS — I10 Essential (primary) hypertension: Secondary | ICD-10-CM | POA: Diagnosis not present

## 2024-08-15 DIAGNOSIS — E1169 Type 2 diabetes mellitus with other specified complication: Secondary | ICD-10-CM

## 2024-08-15 LAB — CBC WITH DIFFERENTIAL/PLATELET

## 2024-08-15 LAB — LIPID PANEL

## 2024-08-15 LAB — BAYER DCA HB A1C WAIVED: HB A1C (BAYER DCA - WAIVED): 5.6 % (ref 4.8–5.6)

## 2024-08-15 NOTE — Progress Notes (Signed)
 Subjective:    Patient ID: Jean [name], female    DOB: [birth year], 73 y.o.   MRN: [id number]   Chief Complaint: annual physical    HPI:  Jean [name] is a 73 y.o. who identifies as a female who was assigned female at birth.   Social history: Lives with: husband Work history: retired from school system   Comes in today for follow up of the following chronic medical issues:  1. Primary hypertension No c/o chest pain, sob or headache. Does not check blood pressure at home. BP Readings from Last 3 Encounters:  04/25/24 (!) 146/86  02/16/24 135/82  08/14/23 130/79     2. Hyperlipidemia associated with type 2 diabetes mellitus (HCC) Does try to watch diet and walks for exercise. Exercise has slowed down some due to knee pain. Lab Results  Component Value Date   CHOL 146 02/16/2024   HDL 49 02/16/2024   LDLCALC 81 02/16/2024   TRIG 81 02/16/2024   CHOLHDL 3.0 02/16/2024   ;The 10-year ASCVD risk score (Arnett DK, et al., 2019) is: 35%   3. Diet-controlled diabetes mellitus (HCC) Does not check her blood sugars at home. Lab Results  Component Value Date   HGBA1C 5.8 (H) 02/16/2024     4. Paroxysmal atrial fibrillation (HCC) 5. SVT (supraventricular tachycardia) I son eliquis  with no bleeding issues. Cardizem  is working well for rate control. Has had no episodes of SVT. Last saw cardiology on 04/24/23.  6. Recurrent major depressive disorder, in full remission (HCC) Has been on zoloft  for  some time  now and is doing well.    02/16/2024    8:05 AM 08/14/2023    7:53 AM 02/12/2023    8:08 AM  Depression screen PHQ 2/9  Decreased Interest 0 0 0  Down, Depressed, Hopeless 0 0 0  PHQ - 2 Score 0 0 0  Altered sleeping 0 0 0  Tired, decreased energy 0 0 0  Change in appetite 0 0 0  Feeling bad or failure about yourself  0 0 0  Trouble concentrating 0 0 0  Moving slowly or fidgety/restless 0 0 0  Suicidal thoughts 0 0 0  PHQ-9 Score 0 0 0  Difficult doing  work/chores Not difficult at all Not difficult at all Not difficult at all      7. BMI 26.0-26.9,adult No recent weight changes. Wt Readings from Last 3 Encounters:  04/25/24 159 lb 6.4 oz (72.3 kg)  02/16/24 156 lb (70.8 kg)  08/14/23 156 lb (70.8 kg)   BMI Readings from Last 3 Encounters:  04/25/24 25.73 kg/m  02/16/24 25.18 kg/m  08/14/23 25.18 kg/m        New complaints: None today  No Known Allergies Outpatient Encounter Medications as of 08/15/2024  Medication Sig   acetaminophen  (TYLENOL ) 325 MG tablet Take 2 tablets (650 mg total) by mouth every 6 (six) hours as needed.   apixaban  (ELIQUIS ) 5 MG TABS tablet Take 1 tablet (5 mg total) by mouth 2 (two) times daily.   Cholecalciferol (D3 2000) 50 MCG (2000 UT) CAPS Take 1 capsule by mouth daily.   diltiazem  (CARDIZEM  CD) 180 MG 24 hr capsule Take 1 capsule (180 mg total) by mouth daily.   Fexofenadine HCl (ALLEGRA PO) Take 1 tablet by mouth daily as needed.    Multiple Vitamins-Minerals (PRESERVISION AREDS 2) CAPS SMARTSIG:1 Tablet(s) By Mouth   Propylene Glycol (SYSTANE COMPLETE OP) Apply 1 drop to eye 4 (four) times daily.  rosuvastatin  (CRESTOR ) 20 MG tablet Take 1 tablet (20 mg total) by mouth daily.   sertraline  (ZOLOFT ) 50 MG tablet Take 1 tablet (50 mg total) by mouth daily.   valsartan -hydrochlorothiazide  (DIOVAN -HCT) 160-12.5 MG tablet Take 1 tablet by mouth daily.   No facility-administered encounter medications on file as of 08/15/2024.    Past Surgical History:  Procedure Laterality Date   BASAL CELL CARCINOMA EXCISION  2021   One on top of head and one on right arm   US  ECHOCARDIOGRAPHY     EF 60%    Family History  Problem Relation Age of Onset   Heart disease Mother    Hypertension Sister    Diabetes Sister    Anemia Paternal Grandmother       Controlled substance contract: n/a     Review of Systems  Constitutional:  Negative for diaphoresis.  Eyes:  Negative for pain.   Respiratory:  Negative for shortness of breath.   Cardiovascular:  Negative for chest pain, palpitations and leg swelling.  Gastrointestinal:  Negative for abdominal pain.  Endocrine: Negative for polydipsia.  Skin:  Negative for rash.  Neurological:  Negative for dizziness, weakness and headaches.  Hematological:  Does not bruise/bleed easily.  All other systems reviewed and are negative.      Objective:   Physical Exam Vitals and nursing note reviewed.  Constitutional:      General: She is not in acute distress.    Appearance: Normal appearance. She is well-developed.  HENT:     Head: Normocephalic.     Right Ear: Tympanic membrane normal.     Left Ear: Tympanic membrane normal.     Nose: Nose normal.     Mouth/Throat:     Mouth: Mucous membranes are moist.  Eyes:     Pupils: Pupils are equal, round, and reactive to light.  Neck:     Vascular: No carotid bruit or JVD.  Cardiovascular:     Rate and Rhythm: Normal rate and regular rhythm.     Heart sounds: Normal heart sounds.  Pulmonary:     Effort: Pulmonary effort is normal. No respiratory distress.     Breath sounds: Normal breath sounds. No wheezing or rales.  Chest:     Chest wall: No tenderness.  Abdominal:     General: Bowel sounds are normal. There is no distension or abdominal bruit.     Palpations: Abdomen is soft. There is no hepatomegaly, splenomegaly, mass or pulsatile mass.     Tenderness: There is no abdominal tenderness.  Musculoskeletal:        General: Normal range of motion.     Cervical back: Normal range of motion and neck supple.  Lymphadenopathy:     Cervical: No cervical adenopathy.  Skin:    General: Skin is warm and dry.  Neurological:     Mental Status: She is alert and oriented to person, place, and time.     Deep Tendon Reflexes: Reflexes are normal and symmetric.  Psychiatric:        Behavior: Behavior normal.        Thought Content: Thought content normal.        Judgment:  Judgment normal.    BP 134/84   Pulse 84   Temp (!) 97.1 F (36.2 C) (Temporal)   Ht 5' 6 (1.676 m)   Wt 159 lb (72.1 kg)   BMI 25.66 kg/m     Hgba1c 5.6%      Assessment & Plan:  Jean [name] comes in today with chief complaint of annual physical  Diagnosis and orders addressed:  1. Primary hypertension Low sodium diet - CBC with Differential/Platelet - CMP14+EGFR - valsartan -hydrochlorothiazide  (DIOVAN -HCT) 160-12.5 MG tablet; Take 1 tablet by mouth daily.  Dispense: 90 tablet; Refill: 1  2. Hyperlipidemia associated with type 2 diabetes mellitus (HCC) Low fat diet - Lipid panel - rosuvastatin  (CRESTOR ) 20 MG tablet; Take 1 tablet (20 mg total) by mouth daily.  Dispense: 90 tablet; Refill: 1  3. Diet-controlled diabetes mellitus (HCC) Continue to watch carbs in diet - Bayer DCA Hb A1c Waived - Microalbumin / creatinine urine ratio  4. Paroxysmal atrial fibrillation (HCC) Report any palpitations  5. SVT (supraventricular tachycardia) Avoid caffeine - apixaban  (ELIQUIS ) 5 MG TABS tablet; Take 1 tablet (5 mg total) by mouth 2 (two) times daily.  Dispense: 60 tablet; Refill: 6 - diltiazem  (CARDIZEM  CD) 180 MG 24 hr capsule; Take 1 capsule (180 mg total) by mouth daily.  Dispense: 90 capsule; Refill: 1  6. Recurrent major depressive disorder, in full remission (HCC) Stress management - sertraline  (ZOLOFT ) 50 MG tablet; Take 1 tablet (50 mg total) by mouth daily.  Dispense: 90 tablet; Refill: 1  7. BMI 26.0-26.9,adult Discussed diet and exercise for person with BMI >25 Will recheck weight in 3-6 months   Labs pending Health Maintenance reviewed Diet and exercise encouraged  Follow up plan: 6 months   Mary-Margaret Gladis, FNP

## 2024-08-15 NOTE — Patient Instructions (Signed)
 Exercising to Stay Healthy To become healthy and stay healthy, it is recommended that you do moderate-intensity and vigorous-intensity exercise. You can tell that you are exercising at a moderate intensity if your heart starts beating faster and you start breathing faster but can still hold a conversation. You can tell that you are exercising at a vigorous intensity if you are breathing much harder and faster and cannot hold a conversation while exercising. How can exercise benefit me? Exercising regularly is important. It has many health benefits, such as: Improving overall fitness, flexibility, and endurance. Increasing bone density. Helping with weight control. Decreasing body fat. Increasing muscle strength and endurance. Reducing stress and tension, anxiety, depression, or anger. Improving overall health. What guidelines should I follow while exercising? Before you start a new exercise program, talk with your health care provider. Do not exercise so much that you hurt yourself, feel dizzy, or get very short of breath. Wear comfortable clothes and wear shoes with good support. Drink plenty of water while you exercise to prevent dehydration or heat stroke. Work out until your breathing and your heartbeat get faster (moderate intensity). How often should I exercise? Choose an activity that you enjoy, and set realistic goals. Your health care provider can help you make an activity plan that is individually designed and works best for you. Exercise regularly as told by your health care provider. This may include: Doing strength training two times a week, such as: Lifting weights. Using resistance bands. Push-ups. Sit-ups. Yoga. Doing a certain intensity of exercise for a given amount of time. Choose from these options: A total of 150 minutes of moderate-intensity exercise every week. A total of 75 minutes of vigorous-intensity exercise every week. A mix of moderate-intensity and  vigorous-intensity exercise every week. Children, pregnant women, people who have not exercised regularly, people who are overweight, and older adults may need to talk with a health care provider about what activities are safe to perform. If you have a medical condition, be sure to talk with your health care provider before you start a new exercise program. What are some exercise ideas? Moderate-intensity exercise ideas include: Walking 1 mile (1.6 km) in about 15 minutes. Biking. Hiking. Golfing. Dancing. Water aerobics. Vigorous-intensity exercise ideas include: Walking 4.5 miles (7.2 km) or more in about 1 hour. Jogging or running 5 miles (8 km) in about 1 hour. Biking 10 miles (16.1 km) or more in about 1 hour. Lap swimming. Roller-skating or in-line skating. Cross-country skiing. Vigorous competitive sports, such as football, basketball, and soccer. Jumping rope. Aerobic dancing. What are some everyday activities that can help me get exercise? Yard work, such as: Child psychotherapist. Raking and bagging leaves. Washing your car. Pushing a stroller. Shoveling snow. Gardening. Washing windows or floors. How can I be more active in my day-to-day activities? Use stairs instead of an elevator. Take a walk during your lunch break. If you drive, park your car farther away from your work or school. If you take public transportation, get off one stop early and walk the rest of the way. Stand up or walk around during all of your indoor phone calls. Get up, stretch, and walk around every 30 minutes throughout the day. Enjoy exercise with a friend. Support to continue exercising will help you keep a regular routine of activity. Where to find more information You can find more information about exercising to stay healthy from: [place] Department of Health and Human Services: [employer] Centers for Disease Control and Prevention (  CDC): [employer] Summary Exercising regularly is  important. It will improve your overall fitness, flexibility, and endurance. Regular exercise will also improve your overall health. It can help you control your weight, reduce stress, and improve your bone density. Do not exercise so much that you hurt yourself, feel dizzy, or get very short of breath. Before you start a new exercise program, talk with your health care provider. This information is not intended to replace advice given to you by your health care provider. Make sure you discuss any questions you have with your health care provider. Document Revised: 04/05/2021 Document Reviewed: 04/05/2021 [employer] Patient Education  2024 [employer].

## 2024-08-16 ENCOUNTER — Ambulatory Visit: Payer: Self-pay | Admitting: Nurse Practitioner

## 2024-08-16 LAB — CMP14+EGFR
ALT: 12 IU/L (ref 0–32)
AST: 18 IU/L (ref 0–40)
Albumin: 4.5 g/dL (ref 3.8–4.8)
Alkaline Phosphatase: 108 IU/L (ref 44–121)
BUN/Creatinine Ratio: 16 (ref 12–28)
BUN: 15 mg/dL (ref 8–27)
Bilirubin Total: 0.9 mg/dL (ref 0.0–1.2)
CO2: 24 mmol/L (ref 20–29)
Calcium: 9.4 mg/dL (ref 8.7–10.3)
Chloride: 100 mmol/L (ref 96–106)
Creatinine, Ser: 0.95 mg/dL (ref 0.57–1.00)
Globulin, Total: 2.5 g/dL (ref 1.5–4.5)
Glucose: 127 mg/dL — ABNORMAL HIGH (ref 70–99)
Potassium: 4.7 mmol/L (ref 3.5–5.2)
Sodium: 141 mmol/L (ref 134–144)
Total Protein: 7 g/dL (ref 6.0–8.5)
eGFR: 63 mL/min/1.73 (ref >59)

## 2024-08-16 LAB — CBC WITH DIFFERENTIAL/PLATELET
Basos: 0
EOS (ABSOLUTE): 0 x10E3/uL (ref 0.0–0.2)
Eos: 4
Hematocrit: 45.2 (ref 34.0–46.6)
Hemoglobin: 14.9 g/dL (ref 11.1–15.9)
Immature Granulocytes: 0
Immature Granulocytes: 0 x10E3/uL (ref 0.0–0.1)
Lymphs: 17
MCH: 30.9 pg (ref 26.6–33.0)
MCHC: 33 g/dL (ref 31.5–35.7)
MCV: 94 fL (ref 79–97)
Monocytes Absolute: 0.2 x10E3/uL (ref 0.0–0.4)
Monocytes Absolute: 0.5 x10E3/uL (ref 0.1–0.9)
Monocytes: 10
Neutrophils Absolute: 0.9 x10E3/uL (ref 0.7–3.1)
Neutrophils Absolute: 3.8 x10E3/uL (ref 1.4–7.0)
Neutrophils: 69
Platelets: 126 x10E3/uL — AB (ref 150–450)
RBC: 4.82 x10E6/uL (ref 3.77–5.28)
RDW: 12.5 (ref 11.7–15.4)
WBC: 5.5 x10E3/uL (ref 3.4–10.8)

## 2024-08-16 LAB — LIPID PANEL
Cholesterol, Total: 144 mg/dL (ref 100–199)
HDL: 46 mg/dL (ref >39)
LDL CALC COMMENT:: 3.1 ratio (ref 0.0–4.4)
LDL Chol Calc (NIH): 82 mg/dL (ref 0–99)
Triglycerides: 83 mg/dL (ref 0–149)
VLDL Cholesterol Cal: 16 mg/dL (ref 5–40)

## 2024-08-16 LAB — MICROALBUMIN / CREATININE URINE RATIO
Creatinine, Urine: 186.7 mg/dL
Microalb/Creat Ratio: 27 mg/g{creat} (ref 0–29)
Microalbumin, Urine: 51 ug/mL

## 2024-10-02 ENCOUNTER — Other Ambulatory Visit: Payer: Self-pay | Admitting: Nurse Practitioner

## 2024-10-02 DIAGNOSIS — I471 Supraventricular tachycardia, unspecified: Secondary | ICD-10-CM

## 2024-10-02 DIAGNOSIS — F3342 Major depressive disorder, recurrent, in full remission: Secondary | ICD-10-CM

## 2024-10-02 DIAGNOSIS — E1169 Type 2 diabetes mellitus with other specified complication: Secondary | ICD-10-CM

## 2024-10-02 DIAGNOSIS — I10 Essential (primary) hypertension: Secondary | ICD-10-CM

## 2024-10-03 ENCOUNTER — Other Ambulatory Visit: Payer: Self-pay | Admitting: Nurse Practitioner

## 2024-10-03 DIAGNOSIS — Z1231 Encounter for screening mammogram for malignant neoplasm of breast: Secondary | ICD-10-CM

## 2024-10-20 ENCOUNTER — Ambulatory Visit
Admission: RE | Admit: 2024-10-20 | Discharge: 2024-10-20 | Disposition: A | Discharge status: Home / Self Care | Source: Ambulatory Visit | Attending: Nurse Practitioner | Admitting: Nurse Practitioner

## 2024-10-20 DIAGNOSIS — Z1231 Encounter for screening mammogram for malignant neoplasm of breast: Secondary | ICD-10-CM | POA: Diagnosis not present

## 2024-11-15 DIAGNOSIS — H353131 Nonexudative age-related macular degeneration, bilateral, early dry stage: Secondary | ICD-10-CM | POA: Diagnosis not present

## 2024-11-15 DIAGNOSIS — H0288B Meibomian gland dysfunction left eye, upper and lower eyelids: Secondary | ICD-10-CM | POA: Diagnosis not present

## 2024-11-15 DIAGNOSIS — H0288A Meibomian gland dysfunction right eye, upper and lower eyelids: Secondary | ICD-10-CM | POA: Diagnosis not present

## 2024-11-15 DIAGNOSIS — H04123 Dry eye syndrome of bilateral lacrimal glands: Secondary | ICD-10-CM | POA: Diagnosis not present

## 2024-11-15 DIAGNOSIS — H26493 Other secondary cataract, bilateral: Secondary | ICD-10-CM | POA: Diagnosis not present

## 2024-11-15 DIAGNOSIS — Z961 Presence of intraocular lens: Secondary | ICD-10-CM | POA: Diagnosis not present

## 2024-12-29 ENCOUNTER — Other Ambulatory Visit: Payer: Self-pay | Admitting: Nurse Practitioner

## 2024-12-29 DIAGNOSIS — F3342 Major depressive disorder, recurrent, in full remission: Secondary | ICD-10-CM

## 2024-12-29 DIAGNOSIS — I10 Essential (primary) hypertension: Secondary | ICD-10-CM

## 2024-12-29 DIAGNOSIS — I471 Supraventricular tachycardia, unspecified: Secondary | ICD-10-CM

## 2024-12-29 DIAGNOSIS — E1169 Type 2 diabetes mellitus with other specified complication: Secondary | ICD-10-CM

## 2025-02-14 ENCOUNTER — Ambulatory Visit: Payer: Self-pay | Admitting: Nurse Practitioner
